# Patient Record
Sex: Male | Born: 1985 | Hispanic: No | Marital: Single | Smoking: Never smoker
Health system: Southern US, Community
[De-identification: ages and names within clinical notes are randomized; demographics above are authoritative.]

## PROBLEM LIST (undated history)

## (undated) HISTORY — PX: SPINE SURGERY: SHX786

---
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
## 2008-01-13 ENCOUNTER — Emergency Department: Payer: Self-pay | Admitting: Emergency Medicine

## 2009-01-17 ENCOUNTER — Ambulatory Visit: Payer: Self-pay | Admitting: Podiatry

## 2010-07-08 ENCOUNTER — Emergency Department: Payer: Self-pay | Admitting: Emergency Medicine

## 2010-12-16 ENCOUNTER — Emergency Department: Payer: Self-pay | Admitting: Unknown Physician Specialty

## 2011-01-11 ENCOUNTER — Emergency Department: Payer: Self-pay | Admitting: *Deleted

## 2011-08-18 DIAGNOSIS — M4120 Other idiopathic scoliosis, site unspecified: Secondary | ICD-10-CM | POA: Insufficient documentation

## 2011-08-18 DIAGNOSIS — M549 Dorsalgia, unspecified: Secondary | ICD-10-CM | POA: Insufficient documentation

## 2013-05-03 ENCOUNTER — Ambulatory Visit: Payer: Self-pay | Admitting: Podiatry

## 2013-06-19 ENCOUNTER — Encounter: Payer: Self-pay | Admitting: Podiatry

## 2013-06-19 ENCOUNTER — Ambulatory Visit: Payer: Self-pay | Admitting: Podiatry

## 2013-06-19 ENCOUNTER — Ambulatory Visit (INDEPENDENT_AMBULATORY_CARE_PROVIDER_SITE_OTHER): Payer: Medicare Other | Admitting: Podiatry

## 2013-06-19 ENCOUNTER — Ambulatory Visit (INDEPENDENT_AMBULATORY_CARE_PROVIDER_SITE_OTHER): Payer: Medicare Other

## 2013-06-19 VITALS — BP 77/50 | HR 110 | Resp 16 | Ht 70.0 in | Wt 220.0 lb

## 2013-06-19 DIAGNOSIS — M79609 Pain in unspecified limb: Secondary | ICD-10-CM

## 2013-06-19 DIAGNOSIS — B351 Tinea unguium: Secondary | ICD-10-CM

## 2013-06-19 DIAGNOSIS — S92919A Unspecified fracture of unspecified toe(s), initial encounter for closed fracture: Secondary | ICD-10-CM

## 2013-06-19 DIAGNOSIS — S92401A Displaced unspecified fracture of right great toe, initial encounter for closed fracture: Secondary | ICD-10-CM

## 2013-06-19 DIAGNOSIS — M79671 Pain in right foot: Secondary | ICD-10-CM

## 2013-06-19 NOTE — Progress Notes (Signed)
He presents today with a chief complaint of painful elongated toenails and painful calluses to the bilateral foot. He states that recently he has fallen by slipping on wet substances grandmothers house and hurting his right foot. He states that most of the pain is right here as he points to the base of the first metatarsal phalangeal joint..  Objective evaluation reveals vital signs are stable he is alert and oriented x3. Nails are thick yellow dystrophic clinically mycotic. He also has reactive hyperkeratosis to the medial aspect of the IP joints of the hallux bilateral his edema to the right foot overlying the metatarsophalangeal joint with pain on direct palpation of the proximal phalanx lateral aspect. Radiographic evaluation on 1 view only does demonstrate a very small fracture of the proximal phalanx nondisplaced non-comminuted.  Assessment: Pain in limb secondary to onychomycosis bilateral. Thick calluses IP joint hallux right. Fracture proximal phalanx hallux right.  Plan: Debridement of all reactive hyperkeratosis and nails 1 through 5 bilateral covered service dispensed a Darco shoe today we'll followup with him in 2 months for set of x-rays

## 2013-07-26 ENCOUNTER — Telehealth: Payer: Self-pay | Admitting: *Deleted

## 2013-07-26 NOTE — Telephone Encounter (Signed)
Pt called and left message stating: i almost fell when i was out in public, my foot does not want to listen and i can not stand on it. i spoke with dr Milinda Pointer and per dr Milinda Pointer pt is to go to emergency room for his foot. i called pt back and received voicemail. i left message stating per dr Milinda Pointer go to emergency room and if you have any questions or concerns to call me back.

## 2013-08-23 ENCOUNTER — Ambulatory Visit (INDEPENDENT_AMBULATORY_CARE_PROVIDER_SITE_OTHER): Payer: Medicare Other

## 2013-08-23 ENCOUNTER — Ambulatory Visit (INDEPENDENT_AMBULATORY_CARE_PROVIDER_SITE_OTHER): Payer: Medicare Other | Admitting: Podiatry

## 2013-08-23 VITALS — Resp 16 | Ht 70.0 in | Wt 220.0 lb

## 2013-08-23 DIAGNOSIS — S92919A Unspecified fracture of unspecified toe(s), initial encounter for closed fracture: Secondary | ICD-10-CM

## 2013-08-23 DIAGNOSIS — M79609 Pain in unspecified limb: Secondary | ICD-10-CM

## 2013-08-23 DIAGNOSIS — B351 Tinea unguium: Secondary | ICD-10-CM

## 2013-08-23 DIAGNOSIS — M79673 Pain in unspecified foot: Secondary | ICD-10-CM

## 2013-08-23 DIAGNOSIS — S92401A Displaced unspecified fracture of right great toe, initial encounter for closed fracture: Secondary | ICD-10-CM

## 2013-08-23 NOTE — Progress Notes (Signed)
He presents today chief complaint of painfully elongated toenails one through 5 bilateral. And swelling to the right hallux.  Objective: Pulses are palpable bilateral. Vital signs are stable he is alert oriented x3. He has mild tenderness on palpation of the proximal phalanx of the hallux. Radiographically this appears to be healing quite nicely. His nails are thick yellow dystrophic onychomycotic with reactive hyperkeratosis plantar medial aspect of the first metatarsophalangeal joint and the hallux bilateral.  Assessment: Pain in limb secondary to onychomycosis bilateral. Well healing hallux fracture right.  Plan: Debridement of nails 1 through 5 bilateral is cover service secondary to pain and debridement all reactive hyperkeratosis.

## 2013-11-29 ENCOUNTER — Ambulatory Visit: Payer: Medicare Other | Admitting: Podiatry

## 2013-11-29 ENCOUNTER — Ambulatory Visit (INDEPENDENT_AMBULATORY_CARE_PROVIDER_SITE_OTHER): Payer: Medicare Other | Admitting: Podiatry

## 2013-11-29 VITALS — BP 103/51 | HR 94 | Resp 16

## 2013-11-29 DIAGNOSIS — M79676 Pain in unspecified toe(s): Secondary | ICD-10-CM

## 2013-11-29 DIAGNOSIS — M79609 Pain in unspecified limb: Secondary | ICD-10-CM

## 2013-11-29 DIAGNOSIS — B351 Tinea unguium: Secondary | ICD-10-CM

## 2013-11-29 NOTE — Progress Notes (Signed)
He presents today complaining of painful corns and calluses as well as painful elongated toenails.  Objective: Vital signs are stable he is alert and oriented x3. Pulses are palpable bilateral. Nails are thick yellow dystrophic with mycotic multiple calluses plantar medial aspect of the first metatarsophalangeal joint of the right foot and IP joint of the hallux bilaterally.  Assessment: Pain in limb secondary to onychomycosis and

## 2014-03-12 ENCOUNTER — Ambulatory Visit: Payer: Medicare Other | Admitting: Podiatry

## 2014-04-16 ENCOUNTER — Ambulatory Visit (INDEPENDENT_AMBULATORY_CARE_PROVIDER_SITE_OTHER): Payer: Medicare Other | Admitting: Podiatry

## 2014-04-16 DIAGNOSIS — B351 Tinea unguium: Secondary | ICD-10-CM

## 2014-04-16 DIAGNOSIS — Q828 Other specified congenital malformations of skin: Secondary | ICD-10-CM

## 2014-04-16 DIAGNOSIS — E119 Type 2 diabetes mellitus without complications: Secondary | ICD-10-CM

## 2014-04-16 DIAGNOSIS — M79676 Pain in unspecified toe(s): Secondary | ICD-10-CM

## 2014-04-16 NOTE — Progress Notes (Signed)
He presents today complaining of painful corns and calluses as well as painful elongated toenails.  Objective: Vital signs are stable he is alert and oriented x3. Pulses are palpable bilateral. Nails are thick yellow dystrophic with mycotic multiple calluses plantar medial aspect of the first metatarsophalangeal joint of the right foot and IP joint of the hallux bilaterally. Painful porokeratotic lesion fifth toe left foot.  Assessment: Pain in limb secondary to onychomycosis and porokeratosis. Diabetes.  Plan: Debrided nails 1 through 5 bilateral. Debrided or keratotic lesion plantar aspect fifth toe left foot. 

## 2014-04-23 ENCOUNTER — Ambulatory Visit: Payer: Medicare Other | Admitting: Podiatry

## 2014-07-16 ENCOUNTER — Ambulatory Visit (INDEPENDENT_AMBULATORY_CARE_PROVIDER_SITE_OTHER): Payer: Medicare Other | Admitting: Podiatry

## 2014-07-16 DIAGNOSIS — Q828 Other specified congenital malformations of skin: Secondary | ICD-10-CM

## 2014-07-16 DIAGNOSIS — M79676 Pain in unspecified toe(s): Secondary | ICD-10-CM | POA: Diagnosis not present

## 2014-07-16 DIAGNOSIS — B351 Tinea unguium: Secondary | ICD-10-CM

## 2014-07-16 NOTE — Progress Notes (Signed)
He presents today complaining of painful corns and calluses as well as painful elongated toenails.  Objective: Vital signs are stable he is alert and oriented x3. Pulses are palpable bilateral. Nails are thick yellow dystrophic with mycotic multiple calluses plantar medial aspect of the first metatarsophalangeal joint of the right foot and IP joint of the hallux bilaterally. Painful porokeratotic lesion fifth toe left foot.  Assessment: Pain in limb secondary to onychomycosis and porokeratosis. Diabetes.  Plan: Debrided nails 1 through 5 bilateral. Debrided or keratotic lesion plantar aspect fifth toe left foot.

## 2014-08-15 DIAGNOSIS — F32A Depression, unspecified: Secondary | ICD-10-CM | POA: Insufficient documentation

## 2014-08-29 DIAGNOSIS — J45909 Unspecified asthma, uncomplicated: Secondary | ICD-10-CM | POA: Insufficient documentation

## 2014-09-07 DIAGNOSIS — E785 Hyperlipidemia, unspecified: Secondary | ICD-10-CM | POA: Insufficient documentation

## 2014-10-08 ENCOUNTER — Ambulatory Visit (INDEPENDENT_AMBULATORY_CARE_PROVIDER_SITE_OTHER): Payer: Medicare Other | Admitting: Podiatry

## 2014-10-08 DIAGNOSIS — E119 Type 2 diabetes mellitus without complications: Secondary | ICD-10-CM | POA: Diagnosis not present

## 2014-10-08 DIAGNOSIS — M79676 Pain in unspecified toe(s): Secondary | ICD-10-CM | POA: Diagnosis not present

## 2014-10-08 DIAGNOSIS — Q828 Other specified congenital malformations of skin: Secondary | ICD-10-CM

## 2014-10-08 DIAGNOSIS — B351 Tinea unguium: Secondary | ICD-10-CM | POA: Diagnosis not present

## 2014-10-08 NOTE — Progress Notes (Signed)
He presents today complaining of painful corns and calluses as well as painful elongated toenails.  Objective: Vital signs are stable he is alert and oriented x3. Pulses are palpable bilateral. Nails are thick yellow dystrophic with mycotic multiple calluses plantar medial aspect of the first metatarsophalangeal joint of the right foot and IP joint of the hallux bilaterally. Painful porokeratotic R hallux lesion noted  Assessment: Pain in limb secondary to onychomycosis and porokeratosis. Diabetes.  Plan: Debrided nails 1 through 5 bilateral. Debrided all lesions, especially R hallux.           Heels also reduced

## 2015-01-09 ENCOUNTER — Ambulatory Visit: Payer: Medicare Other | Admitting: Podiatry

## 2015-01-14 ENCOUNTER — Ambulatory Visit (INDEPENDENT_AMBULATORY_CARE_PROVIDER_SITE_OTHER): Payer: Medicare Other | Admitting: Podiatry

## 2015-01-14 DIAGNOSIS — M79676 Pain in unspecified toe(s): Secondary | ICD-10-CM | POA: Diagnosis not present

## 2015-01-14 DIAGNOSIS — B351 Tinea unguium: Secondary | ICD-10-CM

## 2015-01-14 DIAGNOSIS — Q828 Other specified congenital malformations of skin: Secondary | ICD-10-CM

## 2015-01-14 DIAGNOSIS — E119 Type 2 diabetes mellitus without complications: Secondary | ICD-10-CM

## 2015-01-14 NOTE — Progress Notes (Signed)
He presents today complaining of painful corns and calluses as well as painful elongated toenails.  Objective: Vital signs are stable he is alert and oriented x3. Pulses are palpable bilateral. Nails are thick yellow dystrophic with mycotic multiple calluses plantar medial aspect of the first metatarsophalangeal joint of the right foot and IP joint of the hallux bilaterally. Painful porokeratotic R hallux lesion noted  Assessment: Pain in limb secondary to onychomycosis and porokeratosis. Diabetes.  Plan: Debrided nails 1 through 5 bilateral. Debrided all lesions, especially R hallux.           Heels also reduced and all hyperkeratotic tissue was sharply resected. I encouraged him to soak his feet before coming in for easier debridement.

## 2015-03-07 DIAGNOSIS — M24551 Contracture, right hip: Secondary | ICD-10-CM | POA: Insufficient documentation

## 2015-04-04 ENCOUNTER — Telehealth: Payer: Self-pay | Admitting: *Deleted

## 2015-04-04 NOTE — Telephone Encounter (Signed)
Pt states he is returning a call from a nurse called Gareth Eagle.  I spoke with pt and told him we did not have a Gareth Eagle working here, but I would see if possibly a scheduler called.  I transferred to schedulers and was told they had needed to reschedule pt's appt.

## 2015-04-15 ENCOUNTER — Ambulatory Visit: Payer: Medicare Other

## 2015-04-29 ENCOUNTER — Encounter: Payer: Self-pay | Admitting: Podiatry

## 2015-04-29 ENCOUNTER — Ambulatory Visit (INDEPENDENT_AMBULATORY_CARE_PROVIDER_SITE_OTHER): Payer: Medicare Other | Admitting: Podiatry

## 2015-04-29 DIAGNOSIS — M79676 Pain in unspecified toe(s): Secondary | ICD-10-CM

## 2015-04-29 DIAGNOSIS — B351 Tinea unguium: Secondary | ICD-10-CM

## 2015-04-29 DIAGNOSIS — Q828 Other specified congenital malformations of skin: Secondary | ICD-10-CM

## 2015-04-29 NOTE — Progress Notes (Signed)
He presents today with chief complaint of painful elongated toenails with corns and calluses bilaterally.  Objective: Vital signs are stable he is alert and oriented 3. Pulses are palpable neurologic sensorium is diminished her Semmes-Weinstein monofilament. Reactive hyperkeratosis plantar aspect of bilateral foot with thick yellow dystrophic onychomycotic painful nails.  Assessment: Pain in limb segment onychomycosis. Pain in limb secondary to porokeratosis bilateral.  Plan: Debridement of all reactive hyperkeratosis in toenails bilaterally. Follow up with him in 3 months.

## 2015-07-29 ENCOUNTER — Encounter: Payer: Self-pay | Admitting: Podiatry

## 2015-07-29 ENCOUNTER — Ambulatory Visit (INDEPENDENT_AMBULATORY_CARE_PROVIDER_SITE_OTHER): Payer: Medicare Other | Admitting: Podiatry

## 2015-07-29 DIAGNOSIS — B351 Tinea unguium: Secondary | ICD-10-CM | POA: Diagnosis not present

## 2015-07-29 DIAGNOSIS — Q828 Other specified congenital malformations of skin: Secondary | ICD-10-CM | POA: Diagnosis not present

## 2015-07-29 DIAGNOSIS — M79676 Pain in unspecified toe(s): Secondary | ICD-10-CM

## 2015-07-29 NOTE — Progress Notes (Signed)
He presents today with a chief complaint of painful elongated toenails with calluses to the medial aspect of the first metatarsophalangeal joints bilaterally.  Objective: Vital signs are stable alert and oriented 3. Pulses are palpable. He has severe overgrowth of skin along the medial aspect of the first metatarsophalangeal joint extending out onto the hallux and level of the IP joint. His toenails are thick yellow dystrophic with mycotic and painful palpation.  Assessment: Pain in limb secondary to onychomycosis and porokeratosis.  Plan: Debridement of toenails and callus material bilateral foot. Follow up with [place] in 3 months.

## 2015-10-30 ENCOUNTER — Ambulatory Visit (INDEPENDENT_AMBULATORY_CARE_PROVIDER_SITE_OTHER): Payer: Medicare Other | Admitting: Podiatry

## 2015-10-30 DIAGNOSIS — E119 Type 2 diabetes mellitus without complications: Secondary | ICD-10-CM | POA: Diagnosis not present

## 2015-10-30 DIAGNOSIS — Q828 Other specified congenital malformations of skin: Secondary | ICD-10-CM | POA: Diagnosis not present

## 2015-10-30 DIAGNOSIS — M79676 Pain in unspecified toe(s): Secondary | ICD-10-CM | POA: Diagnosis not present

## 2015-10-30 DIAGNOSIS — B351 Tinea unguium: Secondary | ICD-10-CM

## 2015-10-30 NOTE — Progress Notes (Signed)
He presents today with chief complaint of painful elongated toenails and calluses bilateral.  Objective: Vital signs are stable alert and oriented 3. Pulses are palpable. His toenails are thick yellow dystrophic onychomycotic painful palpation. This thick yellow dystrophic clinic with mycotic nails with multiple reactive hyperkeratosis medial aspect of the foot and the heels bilaterally.  Assessment: Severe xerosis with reactive hyperkeratosis pain in limb secondary to onychomycosis.  Plan: Debridement of toenails in all reactive hyperkeratosis follow up with me as needed.

## 2016-02-05 ENCOUNTER — Encounter: Payer: Self-pay | Admitting: Podiatry

## 2016-02-05 ENCOUNTER — Ambulatory Visit (INDEPENDENT_AMBULATORY_CARE_PROVIDER_SITE_OTHER): Payer: Medicare Other | Admitting: Podiatry

## 2016-02-05 DIAGNOSIS — B351 Tinea unguium: Secondary | ICD-10-CM

## 2016-02-05 DIAGNOSIS — Q828 Other specified congenital malformations of skin: Secondary | ICD-10-CM | POA: Diagnosis not present

## 2016-02-05 DIAGNOSIS — M79676 Pain in unspecified toe(s): Secondary | ICD-10-CM | POA: Diagnosis not present

## 2016-02-05 NOTE — Progress Notes (Signed)
He presents today with chief complaint of painful elongated toenails and thick corns and calluses bilateral foot.  Objective: Pulses are strongly palpable. Toenails are thick yellow dystrophic onychomycotic porokeratotic lesions and calluses plantar aspect of bilateral foot.  Assessment: Pain and limited secondary to onychomycosis keratosis.  Plan: Debridement of toenails 1 through 5 bilateral and debridement of all reactive hyperkeratoses.

## 2016-04-01 ENCOUNTER — Encounter: Payer: Self-pay | Admitting: Podiatry

## 2016-04-01 ENCOUNTER — Ambulatory Visit (INDEPENDENT_AMBULATORY_CARE_PROVIDER_SITE_OTHER): Payer: Medicare Other | Admitting: Podiatry

## 2016-04-01 DIAGNOSIS — B351 Tinea unguium: Secondary | ICD-10-CM

## 2016-04-01 DIAGNOSIS — M79676 Pain in unspecified toe(s): Secondary | ICD-10-CM

## 2016-04-01 DIAGNOSIS — Q828 Other specified congenital malformations of skin: Secondary | ICD-10-CM

## 2016-04-01 NOTE — Progress Notes (Signed)
He presents today chief complaint of painful elongated toenails and calluses of porokeratosis to the plantar aspect of the foot and the fifth toe left.  Objective: Vital signs are stable he is alert and oriented 3 no open lesions or wounds are noted. Pulses are palpable. He has multiple distal clavi porokeratotic lesions plantar aspect of the bilateral foot his toenails are thick yellow dystrophic mycotic. Severe reactive hyperkeratosis to the heels.  Assessment: Pain in limb secondary to onychomycosis and porokeratosis with reactive hyperkeratotic tissue.  Plan: Debridement of all reactive hyperkeratotic tissue and debridement of toenails 1 through 5 bilateral.

## 2016-05-06 ENCOUNTER — Ambulatory Visit: Payer: Medicare Other | Admitting: Podiatry

## 2016-06-29 ENCOUNTER — Ambulatory Visit (INDEPENDENT_AMBULATORY_CARE_PROVIDER_SITE_OTHER): Payer: Medicare Other | Admitting: Podiatry

## 2016-06-29 ENCOUNTER — Encounter: Payer: Self-pay | Admitting: Podiatry

## 2016-06-29 DIAGNOSIS — B351 Tinea unguium: Secondary | ICD-10-CM

## 2016-06-29 DIAGNOSIS — E119 Type 2 diabetes mellitus without complications: Secondary | ICD-10-CM | POA: Diagnosis not present

## 2016-06-29 DIAGNOSIS — M79676 Pain in unspecified toe(s): Secondary | ICD-10-CM

## 2016-06-29 DIAGNOSIS — Q828 Other specified congenital malformations of skin: Secondary | ICD-10-CM | POA: Diagnosis not present

## 2016-06-29 NOTE — Progress Notes (Signed)
He presents today chief complaint of painful elongated toenails with calluses.  Objective: Toenails are long thick yellow dystrophic onychomycotic and porokeratotic lesions with thick tyloma's plantarly. No open lesions or wounds are noticed today.  Assessment: Pain in limb secondary to onychomycosis porokeratosis and tyloma's.  Plan: Debrided all reactive hyperkeratoses. Debrided toenails 1 through 5 bilateral.

## 2016-09-07 ENCOUNTER — Ambulatory Visit (INDEPENDENT_AMBULATORY_CARE_PROVIDER_SITE_OTHER): Payer: Medicare Other | Admitting: Podiatry

## 2016-09-07 ENCOUNTER — Encounter: Payer: Self-pay | Admitting: Podiatry

## 2016-09-07 ENCOUNTER — Telehealth: Payer: Self-pay | Admitting: *Deleted

## 2016-09-07 DIAGNOSIS — Q828 Other specified congenital malformations of skin: Secondary | ICD-10-CM

## 2016-09-07 DIAGNOSIS — M79676 Pain in unspecified toe(s): Secondary | ICD-10-CM

## 2016-09-07 DIAGNOSIS — B351 Tinea unguium: Secondary | ICD-10-CM | POA: Diagnosis not present

## 2016-09-07 DIAGNOSIS — E119 Type 2 diabetes mellitus without complications: Secondary | ICD-10-CM

## 2016-09-07 NOTE — Telephone Encounter (Signed)
Pt states he saw his doctor for his knees and they directed him to Dr. Milinda Pointer. I told pt that in some cases if feet did not have good support it would bother the knees and transferred to schedulers.

## 2016-09-07 NOTE — Progress Notes (Signed)
He presents today to complaint of painful elongated toenails and calluses bilateral. He has a history of neuropathy associated with scoliosis.  Objective: Vital signs are stable he is alert and oriented 3. Pulses are palpable. No erythema edema cellulitis drainage or odor. Calluses are thick yellow dystrophic onychomycotic as are his calluses. No open lesions or wounds are noted.  Assessment: Pain limb cemented onychomycosis and calluses.  Plan: Debrided all reactive hyperkeratoses and toenails.

## 2016-09-28 ENCOUNTER — Ambulatory Visit: Payer: Medicare Other | Admitting: Podiatry

## 2016-10-26 DIAGNOSIS — M1990 Unspecified osteoarthritis, unspecified site: Secondary | ICD-10-CM | POA: Insufficient documentation

## 2016-12-07 ENCOUNTER — Encounter: Payer: Self-pay | Admitting: Podiatry

## 2016-12-07 ENCOUNTER — Ambulatory Visit (INDEPENDENT_AMBULATORY_CARE_PROVIDER_SITE_OTHER): Payer: Medicare Other | Admitting: Podiatry

## 2016-12-07 DIAGNOSIS — B351 Tinea unguium: Secondary | ICD-10-CM

## 2016-12-07 DIAGNOSIS — E119 Type 2 diabetes mellitus without complications: Secondary | ICD-10-CM

## 2016-12-07 DIAGNOSIS — Q828 Other specified congenital malformations of skin: Secondary | ICD-10-CM | POA: Diagnosis not present

## 2016-12-07 DIAGNOSIS — M79676 Pain in unspecified toe(s): Secondary | ICD-10-CM | POA: Diagnosis not present

## 2016-12-07 NOTE — Progress Notes (Signed)
He presents today to complaint of painful elongated toenails and calluses.  Objective: Pulses remain palpable no open lesions or wounds multiple or keratome was long thick yellow dystrophic lytic mycotic nails subungual hematoma fourth nail right. No open lesions.  Assessment: Pain in limb segment onychomycosis porokeratosis diabetes.  Plan: Debridement toenails and reactive hyperkeratotic tissue follow-up with him in 3 months.

## 2017-01-29 DIAGNOSIS — R7302 Impaired glucose tolerance (oral): Secondary | ICD-10-CM | POA: Insufficient documentation

## 2017-03-15 ENCOUNTER — Encounter: Payer: Self-pay | Admitting: Podiatry

## 2017-03-15 ENCOUNTER — Ambulatory Visit (INDEPENDENT_AMBULATORY_CARE_PROVIDER_SITE_OTHER): Payer: Medicare Other | Admitting: Podiatry

## 2017-03-15 DIAGNOSIS — Q828 Other specified congenital malformations of skin: Secondary | ICD-10-CM

## 2017-03-15 DIAGNOSIS — M79676 Pain in unspecified toe(s): Secondary | ICD-10-CM

## 2017-03-15 DIAGNOSIS — B351 Tinea unguium: Secondary | ICD-10-CM | POA: Diagnosis not present

## 2017-03-15 DIAGNOSIS — E119 Type 2 diabetes mellitus without complications: Secondary | ICD-10-CM | POA: Diagnosis not present

## 2017-03-16 NOTE — Progress Notes (Signed)
He presents today with a chief complaint of painful toenails and calluses.  Objective:No lesions or wounds. Toenare thick yellow dystrophic onychomycotic and painful on palpation.  Assessment: Pain in limb secondary to onychomycosis.  Plan: Debrided toenails 1 through 5 bilateral.

## 2017-06-16 ENCOUNTER — Ambulatory Visit (INDEPENDENT_AMBULATORY_CARE_PROVIDER_SITE_OTHER): Payer: Medicare Other | Admitting: Podiatry

## 2017-06-16 ENCOUNTER — Encounter: Payer: Self-pay | Admitting: Podiatry

## 2017-06-16 DIAGNOSIS — E119 Type 2 diabetes mellitus without complications: Secondary | ICD-10-CM | POA: Diagnosis not present

## 2017-06-16 DIAGNOSIS — B351 Tinea unguium: Secondary | ICD-10-CM | POA: Diagnosis not present

## 2017-06-16 DIAGNOSIS — Q828 Other specified congenital malformations of skin: Secondary | ICD-10-CM | POA: Diagnosis not present

## 2017-06-16 DIAGNOSIS — M79676 Pain in unspecified toe(s): Secondary | ICD-10-CM

## 2017-06-16 NOTE — Progress Notes (Signed)
He presents today chief complaint of painful elongated toenails and calluses.  Objective: Vital signs are stable alert and oriented x3.  Pulses are palpable.  Neurologic sensorium is intact.  Deep tendon reflexes are intact.  Nails are thick yellow dystrophic-like mycotic.  Multiple calluses plantar aspect of bilateral foot.  Assessment: Pain in limb secondary onychomycosis.  Debridement of all reactive hyperkeratosis.  Debridement of toenails 1 through 5.

## 2017-09-22 ENCOUNTER — Ambulatory Visit (INDEPENDENT_AMBULATORY_CARE_PROVIDER_SITE_OTHER): Payer: Medicare Other | Admitting: Podiatry

## 2017-09-22 ENCOUNTER — Encounter: Payer: Self-pay | Admitting: Podiatry

## 2017-09-22 DIAGNOSIS — M79676 Pain in unspecified toe(s): Secondary | ICD-10-CM | POA: Diagnosis not present

## 2017-09-22 DIAGNOSIS — Q828 Other specified congenital malformations of skin: Secondary | ICD-10-CM

## 2017-09-22 DIAGNOSIS — B351 Tinea unguium: Secondary | ICD-10-CM | POA: Diagnosis not present

## 2017-09-23 NOTE — Progress Notes (Signed)
Subjective: 32 y.o. returns the office today for painful, elongated, thickened toenails which he cannot trim himself. Denies any redness or drainage around the nails.  He has had quite a bit of pain to the heels as the callus is very thick the left side worse than the right he also has calluses to both of his big toes.  Denies any acute changes since last appointment and no new complaints today. Denies any systemic complaints such as fevers, chills, nausea, vomiting.   PCP: Perrin Maltese, MD  He is borderline diabetic  Objective: AAO 3, NAD DP/PT pulses palpable, CRT less than 3 seconds Nails hypertrophic, dystrophic, elongated, brittle, discolored 10. There is tenderness overlying the nails 1-5 bilaterally. There is no surrounding erythema or drainage along the nail sites. Very thick hyperkeratotic lesions present to the plantar heels the left side worse than right little hyperkeratotic lesions bilateral medial hallux.  Upon debridement there is no underlying ulceration, drainage or any clinical signs of infection. No open lesions or pre-ulcerative lesions are identified. No other areas of tenderness bilateral lower extremities. No overlying edema, erythema, increased warmth. No pain with calf compression, swelling, warmth, erythema.  Assessment: Patient presents with symptomatic onychomycosis, reactive hyperkeratosis  Plan: -Treatment options including alternatives, risks, complications were discussed -Nails sharply debrided 10 without complication/bleeding. -Hyperkeratotic lesion sharply debrided x4 without any complications or bleeding -Discussed daily foot inspection. If there are any changes, to call the office immediately.  -Follow-up in 3 months or sooner if any problems are to arise. In the meantime, encouraged to call the office with any questions, concerns, changes symptoms.  Celesta Gentile, DPM

## 2017-10-20 ENCOUNTER — Ambulatory Visit: Payer: Medicare Other | Admitting: Podiatry

## 2017-12-27 ENCOUNTER — Encounter: Payer: Self-pay | Admitting: Podiatry

## 2017-12-27 ENCOUNTER — Ambulatory Visit (INDEPENDENT_AMBULATORY_CARE_PROVIDER_SITE_OTHER): Payer: Medicare Other | Admitting: Podiatry

## 2017-12-27 DIAGNOSIS — Q828 Other specified congenital malformations of skin: Secondary | ICD-10-CM

## 2017-12-27 DIAGNOSIS — B351 Tinea unguium: Secondary | ICD-10-CM | POA: Diagnosis not present

## 2017-12-27 DIAGNOSIS — M79676 Pain in unspecified toe(s): Secondary | ICD-10-CM | POA: Diagnosis not present

## 2017-12-27 NOTE — Progress Notes (Signed)
He presents today chief complaint of painful elongated toenails and calluses.  Objective: Vital signs are stable alert and oriented x3.  Pulses are palpable.  He still has a long thick yellow dystrophic-like mycotic severe thickening of the calluses bilaterally.  Assessment: Pain in limb secondary onychomycosis and callus development.  Plan: Debridement of all reactive hyperkeratotic tissue debridement of toenails bilaterally.

## 2018-03-04 DIAGNOSIS — K219 Gastro-esophageal reflux disease without esophagitis: Secondary | ICD-10-CM | POA: Insufficient documentation

## 2018-03-30 ENCOUNTER — Encounter: Payer: Self-pay | Admitting: Podiatry

## 2018-03-30 ENCOUNTER — Ambulatory Visit (INDEPENDENT_AMBULATORY_CARE_PROVIDER_SITE_OTHER): Payer: Medicare Other | Admitting: Podiatry

## 2018-03-30 DIAGNOSIS — E119 Type 2 diabetes mellitus without complications: Secondary | ICD-10-CM | POA: Diagnosis not present

## 2018-03-30 DIAGNOSIS — M79676 Pain in unspecified toe(s): Secondary | ICD-10-CM | POA: Diagnosis not present

## 2018-03-30 DIAGNOSIS — B351 Tinea unguium: Secondary | ICD-10-CM | POA: Diagnosis not present

## 2018-03-30 DIAGNOSIS — Q828 Other specified congenital malformations of skin: Secondary | ICD-10-CM

## 2018-03-30 NOTE — Progress Notes (Signed)
He presents today chief complaint of painful elongated toenails with severe corns and calluses.  Objective: Vital signs stable he is alert and oriented x3 toenails are long thick yellow dystrophic-like mycotic painful with severe tylomas plantar aspect of the bilateral foot with dry cracked fissures.  Assessment: Pain in limb secondary to onychomycosis and reactive hyperkeratosis.  Plan: Debridement of all reactive hyperkeratotic tissue debridement of toenails 1 through 5 bilateral.

## 2018-06-29 ENCOUNTER — Ambulatory Visit (INDEPENDENT_AMBULATORY_CARE_PROVIDER_SITE_OTHER): Payer: Medicare Other | Admitting: Podiatry

## 2018-06-29 ENCOUNTER — Encounter: Payer: Self-pay | Admitting: Podiatry

## 2018-06-29 DIAGNOSIS — E119 Type 2 diabetes mellitus without complications: Secondary | ICD-10-CM

## 2018-06-29 DIAGNOSIS — Q828 Other specified congenital malformations of skin: Secondary | ICD-10-CM | POA: Diagnosis not present

## 2018-06-29 DIAGNOSIS — B351 Tinea unguium: Secondary | ICD-10-CM | POA: Diagnosis not present

## 2018-06-29 DIAGNOSIS — M79676 Pain in unspecified toe(s): Secondary | ICD-10-CM | POA: Diagnosis not present

## 2018-06-29 NOTE — Progress Notes (Signed)
He presents today chief complaint painful elongated toenails and calluses.  Objective: Vital signs are stable alert and oriented x3.  Pulses are palpable.  Toenails are long thick yellow dystrophic onychomycotic severe reactive hyperkeratosis bilateral foot.  Assessment: Pain in limb secondary onychomycosis and reactive hyperkeratosis.  Plan: Debrided as much of the reactive hyperkeratotic tissue was I possibly could debrided toenails bilaterally.

## 2018-06-30 ENCOUNTER — Other Ambulatory Visit: Payer: Self-pay

## 2018-06-30 ENCOUNTER — Emergency Department
Admission: EM | Admit: 2018-06-30 | Discharge: 2018-06-30 | Disposition: A | Discharge status: Home / Self Care | Payer: Medicare Other | Attending: Emergency Medicine | Admitting: Emergency Medicine

## 2018-06-30 DIAGNOSIS — Z79899 Other long term (current) drug therapy: Secondary | ICD-10-CM | POA: Diagnosis not present

## 2018-06-30 DIAGNOSIS — A0811 Acute gastroenteropathy due to Norwalk agent: Secondary | ICD-10-CM | POA: Diagnosis not present

## 2018-06-30 DIAGNOSIS — Z91013 Allergy to seafood: Secondary | ICD-10-CM | POA: Insufficient documentation

## 2018-06-30 DIAGNOSIS — R112 Nausea with vomiting, unspecified: Secondary | ICD-10-CM | POA: Diagnosis present

## 2018-06-30 DIAGNOSIS — Z880 Allergy status to penicillin: Secondary | ICD-10-CM | POA: Diagnosis not present

## 2018-06-30 LAB — CBC WITH DIFFERENTIAL/PLATELET
Abs Immature Granulocytes: 0.09 10*3/uL — ABNORMAL HIGH (ref 0.00–0.07)
BASOS ABS: 0.1 10*3/uL (ref 0.0–0.1)
Basophils Relative: 0 %
EOS ABS: 0 10*3/uL (ref 0.0–0.5)
EOS PCT: 0 %
HEMATOCRIT: 46.9 % (ref 39.0–52.0)
HEMOGLOBIN: 14.4 g/dL (ref 13.0–17.0)
IMMATURE GRANULOCYTES: 1 %
LYMPHS ABS: 0.6 10*3/uL — AB (ref 0.7–4.0)
Lymphocytes Relative: 3 %
MCH: 24.7 pg — ABNORMAL LOW (ref 26.0–34.0)
MCHC: 30.7 g/dL (ref 30.0–36.0)
MCV: 80.6 fL (ref 80.0–100.0)
MONOS PCT: 6 %
Monocytes Absolute: 1.1 10*3/uL — ABNORMAL HIGH (ref 0.1–1.0)
NEUTROS PCT: 90 %
Neutro Abs: 17.4 10*3/uL — ABNORMAL HIGH (ref 1.7–7.7)
Platelets: 309 10*3/uL (ref 150–400)
RBC: 5.82 MIL/uL — ABNORMAL HIGH (ref 4.22–5.81)
RDW: 15.2 % (ref 11.5–15.5)
WBC: 19.3 10*3/uL — ABNORMAL HIGH (ref 4.0–10.5)
nRBC: 0 % (ref 0.0–0.2)

## 2018-06-30 LAB — COMPREHENSIVE METABOLIC PANEL
ALBUMIN: 4.4 g/dL (ref 3.5–5.0)
ALK PHOS: 98 U/L (ref 38–126)
ALT: 16 U/L (ref 0–44)
AST: 28 U/L (ref 15–41)
Anion gap: 11 (ref 5–15)
BILIRUBIN TOTAL: 0.9 mg/dL (ref 0.3–1.2)
BUN: 19 mg/dL (ref 6–20)
CO2: 25 mmol/L (ref 22–32)
Calcium: 8.7 mg/dL — ABNORMAL LOW (ref 8.9–10.3)
Chloride: 104 mmol/L (ref 98–111)
Creatinine, Ser: 0.99 mg/dL (ref 0.61–1.24)
GFR calc Af Amer: >60 mL/min (ref >60)
GLUCOSE: 186 mg/dL — AB (ref 70–99)
POTASSIUM: 3.7 mmol/L (ref 3.5–5.1)
Sodium: 140 mmol/L (ref 135–145)
TOTAL PROTEIN: 8.2 g/dL — AB (ref 6.5–8.1)

## 2018-06-30 LAB — GASTROINTESTINAL PANEL BY PCR, STOOL (REPLACES STOOL CULTURE)
ADENOVIRUS F40/41: NOT DETECTED
Astrovirus: NOT DETECTED
Campylobacter species: NOT DETECTED
Cryptosporidium: NOT DETECTED
Cyclospora cayetanensis: NOT DETECTED
ENTAMOEBA HISTOLYTICA: NOT DETECTED
ENTEROPATHOGENIC E COLI (EPEC): NOT DETECTED
ENTEROTOXIGENIC E COLI (ETEC): NOT DETECTED
Enteroaggregative E coli (EAEC): NOT DETECTED
GIARDIA LAMBLIA: NOT DETECTED
NOROVIRUS GI/GII: DETECTED — AB
Plesimonas shigelloides: NOT DETECTED
Rotavirus A: NOT DETECTED
SHIGELLA/ENTEROINVASIVE E COLI (EIEC): NOT DETECTED
Salmonella species: NOT DETECTED
Sapovirus (I, II, IV, and V): NOT DETECTED
Shiga like toxin producing E coli (STEC): NOT DETECTED
VIBRIO CHOLERAE: NOT DETECTED
VIBRIO SPECIES: NOT DETECTED
Yersinia enterocolitica: NOT DETECTED

## 2018-06-30 LAB — LIPASE, BLOOD: LIPASE: 23 U/L (ref 11–51)

## 2018-06-30 LAB — C DIFFICILE QUICK SCREEN W PCR REFLEX
C Diff antigen: NEGATIVE
C Diff interpretation: NOT DETECTED
C Diff toxin: NEGATIVE

## 2018-06-30 MED ORDER — SODIUM CHLORIDE 0.9 % IV BOLUS
1000.0000 mL | Freq: Once | INTRAVENOUS | Status: AC
  Administered 2018-06-30: 1000 mL via INTRAVENOUS

## 2018-06-30 MED ORDER — ONDANSETRON HCL 4 MG/2ML IJ SOLN
4.0000 mg | Freq: Once | INTRAMUSCULAR | Status: AC
  Administered 2018-06-30: 4 mg via INTRAVENOUS
  Filled 2018-06-30: qty 2

## 2018-06-30 MED ORDER — ONDANSETRON 4 MG PO TBDP: 4.0000 mg | ORAL_TABLET | Freq: Three times a day (TID) | ORAL | 0 refills | Status: DC | PRN

## 2018-06-30 MED ORDER — LOPERAMIDE HCL 2 MG PO CAPS
4.0000 mg | ORAL_CAPSULE | Freq: Once | ORAL | Status: AC
  Administered 2018-06-30: 4 mg via ORAL
  Filled 2018-06-30: qty 2

## 2018-06-30 MED ORDER — IBUPROFEN 600 MG PO TABS
600.0000 mg | ORAL_TABLET | Freq: Once | ORAL | Status: AC
  Administered 2018-06-30: 600 mg via ORAL
  Filled 2018-06-30: qty 1

## 2018-06-30 NOTE — ED Triage Notes (Signed)
Pt in with co n.v.d tonight, pt is co pain all over.

## 2018-06-30 NOTE — ED Provider Notes (Signed)
[facility] Emergency Department Provider Note _   First MD Initiated Contact with Patient 06/30/18 [id number]     (approximate)  I have reviewed the triage vital signs and the nursing notes.   HISTORY  Chief Complaint Emesis    HPI Christian Cherry is a 33 y.o. male presents to the emergency department acute onset of nausea vomiting and diarrhea that is nonbloody in nature.  Patient admits to abdominal cramping.  Patient states symptoms began after eating sushi last night.   No past medical history on file.  Patient Active Problem List   Diagnosis Date Noted  . 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 Back pain 08/18/2011  . Idiopathic scoliosis 08/18/2011      Prior to Admission medications   Medication Sig Start Date End Date Taking? Authorizing Provider  beclomethasone (QVAR REDIHALER) 80 MCG/ACT inhaler Qvar 80 mcg/actuation Metered Aerosol oral inhaler    [provider]  budesonide-formoterol (SYMBICORT) 80-4.5 MCG/ACT inhaler Symbicort 80 mcg-4.5 mcg/actuation HFA aerosol inhaler    [provider]  meloxicam (MOBIC) 15 MG tablet meloxicam 15 mg tablet  take 1 tablet by mouth once daily with food    [provider]  omeprazole (PRILOSEC) 40 MG capsule Take by mouth. 09/08/17   [provider]  ondansetron (ZOFRAN) 8 MG tablet ondansetron HCl 8 mg tablet    [provider]  PROAIR HFA 108 (90 BASE) MCG/ACT inhaler  02/25/15   [provider]  QVAR 80 MCG/ACT inhaler  02/19/15   [provider]  sertraline (ZOLOFT) 25 MG tablet Take by mouth.    [provider]  SUMAtriptan (IMITREX) 25 MG tablet Take by mouth.    [provider]  [facility] 80-4.5 MCG/ACT inhaler  02/25/15   [provider]  triamcinolone (KENALOG) 0.025 % cream apply thin layer to LEFT SIDE OF FACE twice a day for 7 to 10 days 01/07/17   [provider]  Vitamin D, Ergocalciferol,  (DRISDOL) 50000 units CAPS capsule Take 50,000 Units by mouth once a week. 01/07/17   [provider]    Allergies Eggs or egg-derived products; Gabapentin; Penicillins; Shellfish allergy; Shellfish-derived products; and Sulfa antibiotics  No family history on file.  Social History Social History   Tobacco Use  . Smoking status: Never Smoker  . Smokeless tobacco: Never Used  Substance Use Topics  . Alcohol use: Yes    Comment: rarely  . Drug use: Not on file    Review of Systems Constitutional: No fever/chills Eyes: No visual changes. ENT: No sore throat. Cardiovascular: Denies chest pain. Respiratory: Denies shortness of breath. Gastrointestinal: Positive for abdominal cramping nausea and vomiting.  Positive diarrhea Genitourinary: Negative for dysuria. Musculoskeletal: Negative for neck pain.  Negative for back pain. Integumentary: Negative for rash. Neurological: Negative for headaches, focal weakness or numbness.   ____________________________________________   PHYSICAL EXAM:  VITAL SIGNS: ED Triage Vitals  Enc Vitals Group     BP 06/30/18 0330 115/74     Pulse Rate 06/30/18 0330 (!) 126     Resp 06/30/18 0330 20     Temp 06/30/18 0330 99.9 F (37.7 C)     Temp Source 06/30/18 0330 Oral     SpO2 06/30/18 0330 100 %     Weight 06/30/18 0331 99.8 kg (220 lb)     Height 06/30/18 0331 1.753 m (5\' 9" )     Head Circumference --      Peak Flow --  Pain Score 06/30/18 0331 10     Pain Loc --      Pain Edu? --      Excl. in [place]? --     Constitutional: Alert and oriented. Well appearing and in no acute distress. Eyes: Conjunctivae are normal.  Mouth/Throat: Mucous membranes are moist.  Oropharynx non-erythematous. Neck: No stridor.  Cardiovascular: Normal rate, regular rhythm. Good peripheral circulation. Grossly normal heart sounds. Respiratory: Normal respiratory effort.  No retractions. Lungs CTAB. Gastrointestinal: Soft and nontender. No  distention.  Musculoskeletal: No lower extremity tenderness nor edema. No gross deformities of extremities. Neurologic:  Normal speech and language. No gross focal neurologic deficits are appreciated.  Skin:  Skin is warm, dry and intact. No rash noted. Psychiatric: Mood and affect are normal. Speech and behavior are normal.  ____________________________________________   LABS (all labs ordered are listed, but only abnormal results are displayed)  Labs Reviewed  GASTROINTESTINAL PANEL BY PCR, STOOL (REPLACES STOOL CULTURE) - Abnormal; Notable for the following components:      Result Value   Norovirus GI/GII DETECTED (*)    All other components within normal limits  CBC WITH DIFFERENTIAL/PLATELET - Abnormal; Notable for the following components:   WBC 19.3 (*)    RBC 5.82 (*)    MCH 24.7 (*)    Neutro Abs 17.4 (*)    Lymphs Abs 0.6 (*)    Monocytes Absolute 1.1 (*)    Abs Immature Granulocytes 0.09 (*)    All other components within normal limits  COMPREHENSIVE METABOLIC PANEL - Abnormal; Notable for the following components:   Glucose, Bld 186 (*)    Calcium 8.7 (*)    Total Protein 8.2 (*)    All other components within normal limits  C DIFFICILE QUICK SCREEN W PCR REFLEX  LIPASE, BLOOD  URINALYSIS, COMPLETE (UACMP) WITH MICROSCOPIC     Procedures   ____________________________________________   INITIAL IMPRESSION / ASSESSMENT AND PLAN / ED COURSE  As part of my medical decision making, I reviewed the following data within the electronic MEDICAL RECORD NUMBER   33 year old male presenting with above-stated history and physical exam secondary to nausea vomiting and diarrhea.  Concern for possible infectious diarrhea and as such stool sample obtained which was positive for norovirus.  Patient given 2 L IV normal saline Zofran 4 mg, Imodium 4 mg p.o. ____________________________________________  FINAL CLINICAL IMPRESSION(S) / ED DIAGNOSES  Final diagnoses:  Enteritis  due to Norovirus     MEDICATIONS GIVEN DURING THIS VISIT:  Medications  ibuprofen (ADVIL,MOTRIN) tablet 600 mg (has no administration in time range)  sodium chloride 0.9 % bolus 1,000 mL (1,000 mLs Intravenous New Bag/Given 06/30/18 0425)  sodium chloride 0.9 % bolus 1,000 mL (0 mLs Intravenous Stopped 06/30/18 0546)  ondansetron (ZOFRAN) injection 4 mg (4 mg Intravenous Given 06/30/18 0426)     ED Discharge Orders    None       Note:  This document was prepared using Dragon voice recognition software and may include unintentional dictation errors.   Gregor Hams, MD 06/30/18 [phone]

## 2018-09-28 ENCOUNTER — Ambulatory Visit: Payer: Medicare Other | Admitting: Podiatry

## 2018-09-28 ENCOUNTER — Encounter: Payer: Self-pay | Admitting: Podiatry

## 2018-09-28 ENCOUNTER — Other Ambulatory Visit: Payer: Self-pay

## 2018-09-28 ENCOUNTER — Ambulatory Visit (INDEPENDENT_AMBULATORY_CARE_PROVIDER_SITE_OTHER): Payer: Medicare Other | Admitting: Podiatry

## 2018-09-28 ENCOUNTER — Ambulatory Visit (INDEPENDENT_AMBULATORY_CARE_PROVIDER_SITE_OTHER): Payer: Medicare Other

## 2018-09-28 VITALS — Temp 98.1°F

## 2018-09-28 DIAGNOSIS — S99922A Unspecified injury of left foot, initial encounter: Secondary | ICD-10-CM | POA: Diagnosis not present

## 2018-09-28 DIAGNOSIS — B351 Tinea unguium: Secondary | ICD-10-CM | POA: Diagnosis not present

## 2018-09-28 DIAGNOSIS — Q828 Other specified congenital malformations of skin: Secondary | ICD-10-CM | POA: Diagnosis not present

## 2018-09-28 DIAGNOSIS — M79676 Pain in unspecified toe(s): Secondary | ICD-10-CM

## 2018-09-28 DIAGNOSIS — E119 Type 2 diabetes mellitus without complications: Secondary | ICD-10-CM | POA: Diagnosis not present

## 2018-09-28 NOTE — Progress Notes (Signed)
He presents today chief complaint of pain to the bilateral forefoot and calluses bilaterally.  Toenails are long and painful.  He is also complaining of injury to the dorsal aspect of the left foot.  Objective: Pulses are palpable.  Swollen left foot with ecchymosis to the toes.  Tender on palpation.  Laceration which appears to be healing very nicely no signs of infection.  Some mild erythema surrounding the border of the lacerated area with scab present.  Toenails are long thick yellow dystrophic-like mycotic very thick callused heels and forefoot bilateral.  Assessment: Pain limb secondary to onychomycosis.  Pain limb secondary to reactive hyper keratomas bilateral.  Radiographs taken today demonstrate fractures of the base of the fourth proximal phalanx left foot.  Plan: Debrided all reactive hyperkeratotic tissue and debrided toenails 1 through 5 bilateral also placed him in a Ace bandage to left foot.

## 2018-12-28 ENCOUNTER — Other Ambulatory Visit: Payer: Self-pay

## 2018-12-28 ENCOUNTER — Ambulatory Visit (INDEPENDENT_AMBULATORY_CARE_PROVIDER_SITE_OTHER): Payer: Medicare Other

## 2018-12-28 ENCOUNTER — Ambulatory Visit (INDEPENDENT_AMBULATORY_CARE_PROVIDER_SITE_OTHER): Payer: Medicare Other | Admitting: Podiatry

## 2018-12-28 ENCOUNTER — Encounter: Payer: Self-pay | Admitting: Podiatry

## 2018-12-28 ENCOUNTER — Ambulatory Visit: Payer: Medicare Other | Admitting: Podiatry

## 2018-12-28 VITALS — Temp 98.5°F

## 2018-12-28 DIAGNOSIS — B351 Tinea unguium: Secondary | ICD-10-CM | POA: Diagnosis not present

## 2018-12-28 DIAGNOSIS — Q828 Other specified congenital malformations of skin: Secondary | ICD-10-CM

## 2018-12-28 DIAGNOSIS — S92502D Displaced unspecified fracture of left lesser toe(s), subsequent encounter for fracture with routine healing: Secondary | ICD-10-CM

## 2018-12-28 DIAGNOSIS — M79676 Pain in unspecified toe(s): Secondary | ICD-10-CM | POA: Diagnosis not present

## 2018-12-28 DIAGNOSIS — E119 Type 2 diabetes mellitus without complications: Secondary | ICD-10-CM | POA: Diagnosis not present

## 2018-12-28 NOTE — Progress Notes (Signed)
He presents today chief complaint of painful elongated toenails and corns and calluses.  Objective: Toenails are thick yellow dystrophic 20 mycotic painful palpation as well as debridement.  Calluses are thick yellow dystrophic.  Assessment: Pain limb secondary onychomycosis porokeratosis.  Plan: Debridement of all reactive hyperkeratotic tissue debridement of toenails 1 through 5 bilateral.

## 2019-03-29 ENCOUNTER — Ambulatory Visit: Payer: Medicare Other | Admitting: Podiatry

## 2019-04-12 ENCOUNTER — Encounter: Payer: Self-pay | Admitting: Podiatry

## 2019-04-12 ENCOUNTER — Ambulatory Visit (INDEPENDENT_AMBULATORY_CARE_PROVIDER_SITE_OTHER): Payer: Medicare Other | Admitting: Podiatry

## 2019-04-12 DIAGNOSIS — Q828 Other specified congenital malformations of skin: Secondary | ICD-10-CM

## 2019-04-12 DIAGNOSIS — E119 Type 2 diabetes mellitus without complications: Secondary | ICD-10-CM | POA: Diagnosis not present

## 2019-04-12 DIAGNOSIS — B351 Tinea unguium: Secondary | ICD-10-CM

## 2019-04-12 DIAGNOSIS — M79676 Pain in unspecified toe(s): Secondary | ICD-10-CM

## 2019-04-12 NOTE — Progress Notes (Signed)
He presents today chief complaint of painfully elongated toenails and calluses bilaterally.  Objective: Pulses are palpable bilateral hammertoe deformities noted bilateral toenails are thick yellow dystrophic-like mycotic and painful.  Multiple areas of tylomas plantar aspect of the bilateral foot which are painful.  No open lesions or wounds are noted.  Assessment: Pain in limb secondary to onychomycosis and tylomas.  Plan: Debrided all reactive hyperkeratotic tissue debrided toenails 1 through 5 bilateral.

## 2019-07-12 ENCOUNTER — Ambulatory Visit: Payer: Medicare Other | Admitting: Podiatry

## 2019-07-28 ENCOUNTER — Telehealth: Payer: Self-pay

## 2019-07-28 NOTE — Telephone Encounter (Signed)
Patient called stated that he thinks he broke his foot, it was swollen, purple, painful to walk.  He said that he has neuropathy and "I know something is wrong when I have a lot of pain."  He said he noticed the pain 5 days ago and never thought anything of it. He denied any fever/chills.   I informed him that the office in [place] was closed for the afternoon and [facility] did not have any available appts.  I encouraged him that he should go to Urgent care or ED to have foot examined.  I also instructed him to wrap with ace bandage, keep elevated while at home and use ice.  He verbalized instructions and will try to go to Urgent care.  He has follow up appt with Dr. Milinda Pointer this Thursday.

## 2019-08-01 NOTE — Telephone Encounter (Signed)
Patient called back this afternoon, stated that he could not get to urgent care or ER because no one was around to take him.  He says his foot looks about the same, still bruised around the ankle, some redness and heat to his foot, swelling, and his 3rd toe looks displaced.  He says he is taking Advil for the pain which is helping.  I asked if he has been running a fever, he stated "not that I know of, I don't feel like it and I don't have a thermometer to check".  I instructed him that he really needs to be evaluated for infection or fracture.  He wants to keep his appt with Dr. Milinda Pointer for tomorrow afternoon.  I encouraged him to have someone bring a thermometer to check his temperature and instructed him to continue with Advil alternating Tylenol, keep foot elevated and iced and stay off as much as possible and we will see him tomorrow.  He is aware that if symptoms become worse to go directly to ER.  He verbalized instructions

## 2019-08-02 ENCOUNTER — Ambulatory Visit (INDEPENDENT_AMBULATORY_CARE_PROVIDER_SITE_OTHER): Payer: Medicare Other

## 2019-08-02 ENCOUNTER — Other Ambulatory Visit: Payer: Self-pay

## 2019-08-02 ENCOUNTER — Ambulatory Visit (INDEPENDENT_AMBULATORY_CARE_PROVIDER_SITE_OTHER): Payer: Medicare Other | Admitting: Podiatry

## 2019-08-02 ENCOUNTER — Encounter: Payer: Self-pay | Admitting: Podiatry

## 2019-08-02 VITALS — Temp 98.2°F

## 2019-08-02 DIAGNOSIS — S99922A Unspecified injury of left foot, initial encounter: Secondary | ICD-10-CM

## 2019-08-02 DIAGNOSIS — E119 Type 2 diabetes mellitus without complications: Secondary | ICD-10-CM | POA: Diagnosis not present

## 2019-08-02 DIAGNOSIS — M79676 Pain in unspecified toe(s): Secondary | ICD-10-CM

## 2019-08-02 DIAGNOSIS — S99921A Unspecified injury of right foot, initial encounter: Secondary | ICD-10-CM | POA: Diagnosis not present

## 2019-08-02 DIAGNOSIS — B351 Tinea unguium: Secondary | ICD-10-CM

## 2019-08-02 DIAGNOSIS — Q828 Other specified congenital malformations of skin: Secondary | ICD-10-CM | POA: Diagnosis not present

## 2019-08-02 NOTE — Progress Notes (Signed)
He presents today for a chief concern of foot and ankle pain to the right foot states that he injured it but with his neuropathy he does not know how he injured it he states that it has not affected his ability to walk though it is tender.  He is also here for his routine nail debridement calluses.  Objective: Vital signs are stable he is alert and oriented x3 severe pes planus with severe hyperkeratotic buildup around the forefoot and rear foot.  Also he has some ecchymosis the medial ankle but appears to be very superficial there is no pain on palpation of this area though he does have some ecchymosis of the third toe which is mildly ecchymotic but radiographs do not demonstrate any type of fractures that are visible.  Toenails are long thick yellow dystrophic and clinically mycotic as well as painful.  Assessment: Contusion foot painful elongated toenails and reactive hyperkeratotic tissue.  Plan: Discussed etiology pathology conservative surgical therapies debrided toenails 1 through 5 debrided reactive hyperkeratotic tissue placed in Ace bandage around the ankle which she will remove and rereplaced daily until his ankle feels better.

## 2019-11-08 ENCOUNTER — Ambulatory Visit: Payer: Medicare Other | Admitting: Podiatry

## 2019-11-09 ENCOUNTER — Other Ambulatory Visit: Payer: Self-pay

## 2019-11-09 ENCOUNTER — Ambulatory Visit (INDEPENDENT_AMBULATORY_CARE_PROVIDER_SITE_OTHER): Payer: Medicare Other | Admitting: Podiatry

## 2019-11-09 ENCOUNTER — Encounter: Payer: Self-pay | Admitting: Podiatry

## 2019-11-09 DIAGNOSIS — L603 Nail dystrophy: Secondary | ICD-10-CM

## 2019-11-09 DIAGNOSIS — S90212A Contusion of left great toe with damage to nail, initial encounter: Secondary | ICD-10-CM | POA: Diagnosis not present

## 2019-11-09 NOTE — Patient Instructions (Signed)

## 2019-11-10 ENCOUNTER — Encounter: Payer: Self-pay | Admitting: Podiatry

## 2019-11-10 NOTE — Progress Notes (Signed)
Subjective:  Patient ID: Christian Cherry, male    DOB: 1986-05-03,  MRN: [id number]  Chief Complaint  Patient presents with  . Nail Problem    trim my nails and the 5th toenail on the left split down the middle this week and did bleed alot     34 y.o. male presents with the above complaint.  Patient presents with complaint of left fifth digit nail dystrophy with splitting of the central nail with lateral side of the nail being painful and detached from the underlying nail bed.  Patient states is split 2 days ago has been bleeding a lot they are sore and tenderness to it.  Hurts to walk.  He would like to have it removed.  He denies any other acute complaints.   Review of Systems: Negative except as noted in the HPI. Denies N/V/F/Ch.  No past medical history on file.  Current Outpatient Medications:  .  beclomethasone (QVAR REDIHALER) 80 MCG/ACT inhaler, Qvar 80 mcg/actuation Metered Aerosol oral inhaler, Disp: , Rfl:  .  budesonide-formoterol (SYMBICORT) 80-4.5 MCG/ACT inhaler, Symbicort 80 mcg-4.5 mcg/actuation HFA aerosol inhaler, Disp: , Rfl:  .  meloxicam (MOBIC) 15 MG tablet, meloxicam 15 mg tablet  take 1 tablet by mouth once daily with food, Disp: , Rfl:  .  omeprazole (PRILOSEC) 40 MG capsule, Take by mouth., Disp: , Rfl:  .  ondansetron (ZOFRAN ODT) 4 MG disintegrating tablet, Take 1 tablet (4 mg total) by mouth every 8 (eight) hours as needed., Disp: 20 tablet, Rfl: 0 .  ondansetron (ZOFRAN) 8 MG tablet, ondansetron HCl 8 mg tablet, Disp: , Rfl:  .  PROAIR HFA 108 (90 BASE) MCG/ACT inhaler, , Disp: , Rfl: 0 .  QVAR 80 MCG/ACT inhaler, , Disp: , Rfl: 0 .  sertraline (ZOLOFT) 25 MG tablet, Take by mouth., Disp: , Rfl:  .  SUMAtriptan (IMITREX) 25 MG tablet, Take by mouth., Disp: , Rfl:  .  SYMBICORT 80-4.5 MCG/ACT inhaler, , Disp: , Rfl: 0 .  triamcinolone (KENALOG) 0.025 % cream, apply thin layer to LEFT SIDE OF FACE twice a day for 7 to 10 days, Disp: , Rfl: 0 .  Vitamin D,  Ergocalciferol, (DRISDOL) 50000 units CAPS capsule, Take 50,000 Units by mouth once a week., Disp: , Rfl: 0  Social History   Tobacco Use  Smoking Status Never Smoker  Smokeless Tobacco Never Used    Allergies  Allergen Reactions  . Eggs Or Egg-Derived Products Diarrhea  . Gabapentin Other (See Comments)    Other reaction(s): Unknown  . Penicillins Other (See Comments)    unknown  . Shellfish Allergy Hives  . Shellfish-Derived Products Hives  . Sulfa Antibiotics Rash and Other (See Comments)   Objective:  There were no vitals filed for this visit. There is no height or weight on file to calculate BMI. Constitutional Well developed. Well nourished.  Vascular Dorsalis pedis pulses palpable bilaterally. Posterior tibial pulses palpable bilaterally. Capillary refill normal to all digits.  No cyanosis or clubbing noted. Pedal hair growth normal.  Neurologic Normal speech. Oriented to person, place, and time. Epicritic sensation to light touch grossly present bilaterally.  Dermatologic Pain on palpation of the entire/total nail on 5th digit of the left No other open wounds. No skin lesions.  Orthopedic: Normal joint ROM without pain or crepitus bilaterally. No visible deformities. No bony tenderness.   Radiographs: None Assessment:   1. Nail dystrophy   2. Contusion of left great toe with damage to nail, initial  encounter    Plan:  Patient was evaluated and treated and all questions answered.  Nail contusion/dystrophy fifth, left -Patient elects to proceed with minor surgery to remove entire toenail today. Consent reviewed and signed by patient. -Entire/total nail excised. See procedure note. -Educated on post-procedure care including soaking. Written instructions provided and reviewed. -Patient to follow up in 2 weeks for nail check.  Procedure: Excision of entire/total nail  Location: Left 5th toe digit Anesthesia: Lidocaine 1% plain; 1.5 mL and Marcaine 0.5%  plain; 1.5 mL, digital block. Skin Prep: Betadine. Dressing: Silvadene; telfa; dry, sterile, compression dressing. Technique: Following skin prep, the toe was exsanguinated and a tourniquet was secured at the base of the toe. The affected nail border was freed and excised. The tourniquet was then removed and sterile dressing applied. Disposition: Patient tolerated procedure well. Patient to return in 2 weeks for follow-up.   No follow-ups on file.

## 2019-11-20 ENCOUNTER — Ambulatory Visit (INDEPENDENT_AMBULATORY_CARE_PROVIDER_SITE_OTHER): Payer: Medicare Other | Admitting: Dermatology

## 2019-11-20 ENCOUNTER — Other Ambulatory Visit: Payer: Self-pay

## 2019-11-20 DIAGNOSIS — R21 Rash and other nonspecific skin eruption: Secondary | ICD-10-CM | POA: Diagnosis not present

## 2019-11-20 DIAGNOSIS — L409 Psoriasis, unspecified: Secondary | ICD-10-CM

## 2019-11-20 MED ORDER — MOMETASONE FUROATE 0.1 % EX SOLN: Freq: Every day | CUTANEOUS | 3 refills | Status: DC

## 2019-11-20 MED ORDER — MOMETASONE FUROATE 0.1 % EX CREA: 1.0000 "application " | TOPICAL_CREAM | Freq: Every day | CUTANEOUS | 1 refills | Status: DC | PRN

## 2019-11-20 MED ORDER — KETOCONAZOLE 2 % EX SHAM: 1.0000 "application " | MEDICATED_SHAMPOO | Freq: Once | CUTANEOUS | 6 refills | Status: AC

## 2019-11-20 NOTE — Progress Notes (Signed)
   New Patient Visit  Subjective  Christian Cherry is a 34 y.o. male who presents for the following: New Patient (Initial Visit).  Patient presents today as a new patient with mother, patient has a few areas of concern on scalp, for a long time, L pretibia x 6 years, B/L forearms and R cheek and also has dark circles under b/l eyes. Patient has had his 5th left toenail removed on Thursday by [facility].  The following portions of the chart were reviewed this encounter and updated as appropriate:  Tobacco  Allergies  Meds  Problems  Med Hx  Surg Hx  Fam Hx      Review of Systems:  No other skin or systemic complaints except as noted in HPI or Assessment and Plan.  Objective  Well appearing patient in no apparent distress; mood and affect are within normal limits.  A focused examination was performed including Head, B/L forearms, right cheek and left pretibia. Relevant physical exam findings are noted in the Assessment and Plan.  Objective  Scalp and beard area: Pink scale on scalp  Objective  Left Forearm - Anterior, Left Lower Leg - Anterior: Patchy pink eruption with crusting Pink eruption on b/l forearms   Assessment & Plan    Psoriasis Scalp and beard area  Psoriasis vs sebo psoriasis  Start Ketoconazole 2% shampoo M-W-F daily apply to scalp and beard area leave in 5 min then rinse  Start Mometasone solution to scalp at bedtime M-W-F avoid face, groin and underarms  mometasone (ELOCON) 0.1 % lotion - Scalp and beard area  ketoconazole (NIZORAL) 2 % shampoo - Scalp and beard area  Rash (2) Left Forearm - Anterior; Left Lower Leg - Anterior  Psoriasis vs eczema  Start mometasone cream Twice daily 5 days a week til rash clears then as needed to affected areas on left leg and b/l forearms. Avoid Face, groin, and underarms  Ordered Medications: mometasone (ELOCON) 0.1 % cream  Return in about 2 months (around 01/20/2020) for psoriasis.  Marene Lenz, CMA,  am acting as scribe for Sarina Ser, MD . Documentation: I have reviewed the above documentation for accuracy and completeness, and I agree with the above.  Sarina Ser, MD

## 2019-11-20 NOTE — Patient Instructions (Addendum)
Recommend daily broad spectrum sunscreen SPF 30+ to sun-exposed areas, reapply every 2 hours as needed. Call for new or changing lesions.  Topical steroids (such as triamcinolone, fluocinolone, fluocinonide, mometasone, clobetasol, halobetasol, betamethasone, hydrocortisone) can cause thinning and lightening of the skin if they are used for too long in the same area. Your physician has selected the right strength medicine for your problem and area affected on the body. Please use your medication only as directed by your physician to prevent side effects.   Avoid applying to face, groin, and axilla. Use as directed. Risk of skin atrophy with long-term use reviewed.    SCALP:  Start Ketoconazole 2% shampoo M-W-F daily apply to scalp and beard area leave in 5 min then rinse  Start Mometasone solution to scalp at bedtime M-W-F avoid face, groin and underarms   LEGS, ARMS Start mometasone cream Twice daily 5 days a week til rash clears then as needed to affected areas on left leg and b/l forearms. Avoid Face, groin, and underarms  Psoriasis  What causes psoriasis? A patient's immune system plays a role in the development of psoriasis through the over-activity of a type of white blood cell called a T cell.  Once these cells are activated, a reaction is triggered that causes the skin to grow faster than normal.  New skin cells form in days instead of weeks, and these cells do not shed.  These cells pile up on the skin, and this results in what you see as psoriasis.  It is not contagious.  There is a genetic component to psoriasis, although not everyone inherits the gene.  What triggers psoriasis? Common triggers are stress, strep throat infection (more commonly in kids), and cold weather.  During stressful events, psoriasis tends to flare.  Certain medications such as lithium, some blood pressure medications, and some drugs used to treat malaria can trigger psoriasis.  A skin injury can also trigger  psoriasis to develop at the site of injury.  Types of Psoriasis 1. Plaque psoriasis:  80% of patients with psoriasis have plaque psoriasis.  Plaques usually form on elbows, knees, and lower back and appear raised and reddish with a silvery white scale. 2. Nail psoriasis:  Fingernails and toenails may be affected.  Initially small pits may form, then with time, the nails thicken, lift up, and crumble.   3. Scalp psoriasis:  Similar in appearance to plaque psoriasis on the body.  Tends to be itchy.  Clinically can resemble dandruff because of the scales that can fall on a patient's shirt.  This may be difficult to control. 4. Pustular psoriasis:  Usually involves the palms and soles appearing as white, pus-filled bumps.  Rarely, it may develop all over the body, which can make patients seriously ill. 5. Guttate psoriasis:  Usually occurs in children and young adults.  The lesions are smaller than plaque psoriasis.  May be triggered by a strep throat infection.  Many times it clears on its own, and the patient may or may not develop psoriasis again.   6. Inverse psoriasis:  Involves the folds of the skin and may be painful.  This appears as red, shiny patches.  It may involve the armpits, under the breasts, the genital area, or the crease of the buttock.  7. Psoriatic arthritis:  Up to one third of patients with psoriasis will develop arthritis.  It commonly affects the hands, feet, and spine, and may begin as stiffness.  If untreated it may lead to permanent  joint damage.  Treatment There is no cure for psoriasis, but it can be controlled.  Some patients undergo more than one type of treatment.  1. Topical medications (applied externally to the skin) a. Corticosteroids:  typically first-line treatment for psoriasis.  They control the inflammation of psoriasis.  They are available as creams, ointments, sprays, foams, or lotions.  It is important to follow your dermatologist's instructions as to how to  apply the medicine.  For example, excessive use of strong steroid creams (especially to body creases and the face) can cause thinning and lightening of the skin.  This can lead to the formation of stretch marks in the folds.   b. Calcipotriene and calcipotriol (Vitamin D derivatives):  These are usually used in conjunction with a steroid cream.  Sometimes they may be used as maintenance once psoriasis is under control. c. Retinoids:  sometimes used in conjunction with a topical steroid cream.  Women should not use retinoids if they are pregnant. d. Coal tar:  an older treatment that is still effective, especially in combination with steroids.  It can be messy and may have an odor in some preparations. 2. Light treatments:  may provide a safe and effective treatment for psoriasis.  The main risks of light therapy are sunburn and possible increase in skin cancers.   a. Laser therapy:  can treat a certain stubborn area of psoriasis such as scalp, feet, and hands.  It is not used for large areas. b. Narrowband UVB:  A patient stands in front of panels of lights for a set amount of time.  A typical course might be 24 treatments over 2 months.  c. PUVA:  This treatment combines exposure to UVA light with light-sensitizing medication called psoralen.  It may come as a pill or as a lotion.  The main side effects are nausea (from the psoralen pill) and significantly increased risk of skin cancer. 3. Oral medications:  used for moderate to severe psoriasis.  These medications are very effective, but they have a number of potentially serious side effects. a. Methotrexate b. Soriatane (acitretin) c. Cyclosporine 4. Biologic medications:  used for moderate to severe psoriasis.  These are newer medications that suppress the immune cells responsible for psoriasis.  They generally produce good results, but they all increase the risk of infection.  These are given as injections or IV infusions. a. Enbrel  (etanercept) b. Humira (adalimumab) c. Remicade (infliximab) d. Stelara (ustekinumab)  Recent studies have found that patients with psoriasis are more prone to developing metabolic syndrome:  high blood pressure, coronary artery disease, high cholesterol, and diabetes.  It is very important for patients with psoriasis to live healthy lifestyles.  Diet and exercise are important.  Support groups:   www.psoriasis.org , http://www.[facility].com/psoriasisnet/index.html

## 2019-12-03 ENCOUNTER — Encounter: Payer: Self-pay | Admitting: Dermatology

## 2020-01-24 ENCOUNTER — Other Ambulatory Visit: Payer: Self-pay | Admitting: Orthopedic Surgery

## 2020-01-24 DIAGNOSIS — M162 Bilateral osteoarthritis resulting from hip dysplasia: Secondary | ICD-10-CM

## 2020-01-24 DIAGNOSIS — M25559 Pain in unspecified hip: Secondary | ICD-10-CM

## 2020-01-24 DIAGNOSIS — M25859 Other specified joint disorders, unspecified hip: Secondary | ICD-10-CM

## 2020-01-25 ENCOUNTER — Ambulatory Visit: Payer: Medicare Other | Admitting: Dermatology

## 2020-01-25 DIAGNOSIS — M79676 Pain in unspecified toe(s): Secondary | ICD-10-CM

## 2020-02-14 ENCOUNTER — Ambulatory Visit: Payer: Medicare Other | Admitting: Podiatry

## 2020-02-15 ENCOUNTER — Other Ambulatory Visit: Payer: Self-pay

## 2020-02-15 ENCOUNTER — Ambulatory Visit
Admission: RE | Admit: 2020-02-15 | Discharge: 2020-02-15 | Disposition: A | Discharge status: Home / Self Care | Payer: Medicare Other | Source: Ambulatory Visit | Attending: Orthopedic Surgery | Admitting: Orthopedic Surgery

## 2020-02-15 DIAGNOSIS — M25551 Pain in right hip: Secondary | ICD-10-CM | POA: Diagnosis present

## 2020-02-15 DIAGNOSIS — M25559 Pain in unspecified hip: Secondary | ICD-10-CM

## 2020-02-15 DIAGNOSIS — S76011A Strain of muscle, fascia and tendon of right hip, initial encounter: Secondary | ICD-10-CM | POA: Insufficient documentation

## 2020-02-15 DIAGNOSIS — M162 Bilateral osteoarthritis resulting from hip dysplasia: Secondary | ICD-10-CM

## 2020-02-15 DIAGNOSIS — M25859 Other specified joint disorders, unspecified hip: Secondary | ICD-10-CM

## 2020-02-15 DIAGNOSIS — X58XXXA Exposure to other specified factors, initial encounter: Secondary | ICD-10-CM | POA: Insufficient documentation

## 2020-02-15 MED ORDER — SODIUM CHLORIDE (PF) 0.9 % IJ SOLN
5.0000 mL | Freq: Once | INTRAMUSCULAR | Status: AC
  Administered 2020-02-15: 5 mL

## 2020-02-15 MED ORDER — LIDOCAINE HCL (PF) 1 % IJ SOLN
5.0000 mL | Freq: Once | INTRAMUSCULAR | Status: AC
  Administered 2020-02-15: 5 mL
  Filled 2020-02-15: qty 5

## 2020-02-15 MED ORDER — IOHEXOL 180 MG/ML  SOLN
15.0000 mL | Freq: Once | INTRAMUSCULAR | Status: AC | PRN
  Administered 2020-02-15: 15 mL via INTRA_ARTICULAR

## 2020-02-15 MED ORDER — GADOBUTROL 1 MMOL/ML IV SOLN
0.0500 mL | Freq: Once | INTRAVENOUS | Status: AC | PRN
  Administered 2020-02-15: 0.05 mL

## 2020-03-04 ENCOUNTER — Ambulatory Visit (INDEPENDENT_AMBULATORY_CARE_PROVIDER_SITE_OTHER): Payer: Medicare Other | Admitting: Podiatry

## 2020-03-04 ENCOUNTER — Other Ambulatory Visit: Payer: Self-pay

## 2020-03-04 DIAGNOSIS — E119 Type 2 diabetes mellitus without complications: Secondary | ICD-10-CM

## 2020-03-04 DIAGNOSIS — B351 Tinea unguium: Secondary | ICD-10-CM | POA: Diagnosis not present

## 2020-03-04 DIAGNOSIS — M79676 Pain in unspecified toe(s): Secondary | ICD-10-CM

## 2020-03-04 DIAGNOSIS — Q828 Other specified congenital malformations of skin: Secondary | ICD-10-CM

## 2020-03-04 NOTE — Progress Notes (Signed)
He presents today chief complaint of painfully elongated toenails with calluses.  Objective: Toenails are long thick yellow dystrophic-like mycotic painful palpation as well as debridement reactive hyperkeratotic tissue plantar aspect of the bilateral foot.  Assessment: Pain in limb secondary to onychomycosis and porokeratosis.  Plan: Debridement of toenails 1 through 5 bilateral.

## 2020-04-04 ENCOUNTER — Other Ambulatory Visit: Payer: Self-pay

## 2020-04-04 ENCOUNTER — Ambulatory Visit (INDEPENDENT_AMBULATORY_CARE_PROVIDER_SITE_OTHER): Payer: Medicare Other | Admitting: Dermatology

## 2020-04-04 DIAGNOSIS — L408 Other psoriasis: Secondary | ICD-10-CM

## 2020-04-04 DIAGNOSIS — L409 Psoriasis, unspecified: Secondary | ICD-10-CM | POA: Diagnosis not present

## 2020-04-04 MED ORDER — KETOCONAZOLE 2 % EX SHAM: MEDICATED_SHAMPOO | CUTANEOUS | 6 refills | Status: DC

## 2020-04-04 MED ORDER — HYDROCORTISONE 2.5 % EX CREA: TOPICAL_CREAM | CUTANEOUS | 11 refills | Status: DC

## 2020-04-04 MED ORDER — MOMETASONE FUROATE 0.1 % EX SOLN: Freq: Every day | CUTANEOUS | 3 refills | Status: DC

## 2020-04-04 NOTE — Progress Notes (Signed)
° °  Follow-Up Visit   Subjective  Christian Cherry is a 34 y.o. male who presents for the following: Psoriasis and 5 months f/u Sebo Psoriasis on the face treating with Ketoconazole 2% shampoo with good response ).  The following portions of the chart were reviewed this encounter and updated as appropriate:  Tobacco   Allergies   Meds   Problems   Med Hx   Surg Hx   Fam Hx      Review of Systems:  No other skin or systemic complaints except as noted in HPI or Assessment and Plan.  Objective  Well appearing patient in no apparent distress; mood and affect are within normal limits.  A focused examination was performed including face, legs . Relevant physical exam findings are noted in the Assessment and Plan.  Objective  Head - Anterior (Face): Pink patches with greasy scale.   Objective  Left Lower Leg - Anterior: Guttate plaques on the L pretibial    Assessment & Plan  Sebo- psoriasis Face; scalp Start Hydrocortisone 2.5% cream apply to face  M/W/F Start Mometasone solution apply to scalp  M/W/F  Cont Ketoconazole 2% shampoo M/W/F  Ordered Medications: ketoconazole (NIZORAL) 2 % shampoo hydrocortisone 2.5 % cream  Psoriasis Left Lower Leg - Anterior Chronic, not currently at goal.   Psoriasis is a chronic non-curable, but treatable genetic/hereditary disease that may have other systemic features affecting other organ systems such as joints (Psoriatic Arthritis). It is associated with an increased risk of inflammatory bowel disease, heart disease, non-alcoholic fatty liver disease, and depression.     Start otc Cerave cream daily  Cont Mometasone cream apply to skin qd-bid prn   Reordered Medications mometasone (ELOCON) 0.1 % lotion  Return in about 6 months (around 10/02/2020).  IMarye Round, CMA, am acting as scribe for Sarina Ser, MD .  Documentation: I have reviewed the above documentation for accuracy and completeness, and I agree with the above.  Sarina Ser, MD

## 2020-04-09 ENCOUNTER — Encounter: Payer: Self-pay | Admitting: Dermatology

## 2020-05-10 DIAGNOSIS — E559 Vitamin D deficiency, unspecified: Secondary | ICD-10-CM | POA: Insufficient documentation

## 2020-05-14 ENCOUNTER — Telehealth: Payer: Self-pay

## 2020-05-14 NOTE — Telephone Encounter (Signed)
Pt called today stating that his heel has opened and would like to get in to see Dr. Milinda Pointer. Please advise.

## 2020-05-15 ENCOUNTER — Other Ambulatory Visit: Payer: Self-pay

## 2020-05-15 ENCOUNTER — Encounter: Payer: Self-pay | Admitting: Podiatry

## 2020-05-15 ENCOUNTER — Ambulatory Visit (INDEPENDENT_AMBULATORY_CARE_PROVIDER_SITE_OTHER): Payer: Medicare Other | Admitting: Podiatry

## 2020-05-15 DIAGNOSIS — B353 Tinea pedis: Secondary | ICD-10-CM

## 2020-05-15 DIAGNOSIS — R234 Changes in skin texture: Secondary | ICD-10-CM

## 2020-05-15 DIAGNOSIS — L84 Corns and callosities: Secondary | ICD-10-CM

## 2020-05-15 MED ORDER — KETOCONAZOLE 2 % EX CREA: 1.0000 "application " | TOPICAL_CREAM | Freq: Every day | CUTANEOUS | 2 refills | Status: DC

## 2020-05-15 NOTE — Patient Instructions (Addendum)
Look for urea 40% cream or ointment and apply to the thickened dry skin / calluses. This can be bought over the counter, at a pharmacy or online such as [employer].   Use the urea cream twice daily after aggressively using a pumice stone   Speak with your skin care doctor about the calluses at your next appointment

## 2020-05-17 NOTE — Progress Notes (Signed)
  Subjective:  Patient ID: Christian Cherry, male    DOB: 1985/08/08,  MRN: [id number]  Chief Complaint  Patient presents with  . Callouses    Patient presents today for painful crack in right heel which he noticed yesterday.  He says its really sore and his toenails are sore     34 y.o. male presents with the above complaint. History confirmed with patient. This is been a recurrent issue for him he is previously seen Dr. Milinda Pointer for this. He puts Vaseline on it currently.  Objective:  Physical Exam: warm, good capillary refill, no trophic changes or ulcerative lesions, normal DP and PT pulses and normal sensory exam. Bilateral heels with significant hyperkeratosis and fissuring Assessment:  No diagnosis found.   Plan:  Patient was evaluated and treated and all questions answered.   Today I debrided the hyperkeratosis sharply with a chisel blade to his tolerance. I think this is likely secondary to both xerosis cutis and tinea pedis. Prescription for ketoconazole 2% was sent to his pharmacy. He also sees a dermatologist for other issues and I encouraged him to talk to them about this as well. Recommended he use urea cream twice daily.  Return if symptoms worsen or fail to improve.

## 2020-06-05 ENCOUNTER — Encounter: Payer: Self-pay | Admitting: Podiatry

## 2020-06-05 ENCOUNTER — Ambulatory Visit (INDEPENDENT_AMBULATORY_CARE_PROVIDER_SITE_OTHER): Payer: Medicare Other | Admitting: Podiatry

## 2020-06-05 ENCOUNTER — Other Ambulatory Visit: Payer: Self-pay

## 2020-06-05 DIAGNOSIS — Q828 Other specified congenital malformations of skin: Secondary | ICD-10-CM | POA: Diagnosis not present

## 2020-06-05 DIAGNOSIS — B351 Tinea unguium: Secondary | ICD-10-CM | POA: Diagnosis not present

## 2020-06-05 DIAGNOSIS — E119 Type 2 diabetes mellitus without complications: Secondary | ICD-10-CM | POA: Diagnosis not present

## 2020-06-05 DIAGNOSIS — M79676 Pain in unspecified toe(s): Secondary | ICD-10-CM

## 2020-06-05 NOTE — Progress Notes (Signed)
He presents today chief complaint of painful elongated toenails and calluses bilaterally.  Objective: Vital signs are stable he is alert and oriented x3 there is no erythema edema cellulitis drainage or odor toenails are long thick yellow dystrophic with mycotic painful palpation as well as debridement.  Assessment: Pain in limb secondary to onychomycosis.  Plan: Debrided toenails 1 through 5 bilateral.

## 2020-09-04 ENCOUNTER — Other Ambulatory Visit: Payer: Self-pay

## 2020-09-04 ENCOUNTER — Encounter: Payer: Self-pay | Admitting: Podiatry

## 2020-09-04 ENCOUNTER — Ambulatory Visit (INDEPENDENT_AMBULATORY_CARE_PROVIDER_SITE_OTHER): Payer: Medicare Other | Admitting: Podiatry

## 2020-09-04 DIAGNOSIS — D2371 Other benign neoplasm of skin of right lower limb, including hip: Secondary | ICD-10-CM

## 2020-09-04 DIAGNOSIS — E119 Type 2 diabetes mellitus without complications: Secondary | ICD-10-CM | POA: Diagnosis not present

## 2020-09-04 DIAGNOSIS — D2372 Other benign neoplasm of skin of left lower limb, including hip: Secondary | ICD-10-CM

## 2020-09-04 DIAGNOSIS — M79676 Pain in unspecified toe(s): Secondary | ICD-10-CM

## 2020-09-04 DIAGNOSIS — B351 Tinea unguium: Secondary | ICD-10-CM

## 2020-09-04 NOTE — Progress Notes (Signed)
He presents today chief complaint of painfully elongated nails and calluses.  Objective: Pulses are palpable no open lesions or wounds.  Toenails are long thick yellow dystrophic-like mycotic multiple reactive hyperkeratotic lesions.  Assessment: Pain in limb secondary to onychomycosis and benign skin lesions.  Plan: Debridement of benign skin lesions debridement of nails 1 through 5 bilateral.  Follow-up with him in 3 months

## 2020-10-03 ENCOUNTER — Other Ambulatory Visit: Payer: Self-pay

## 2020-10-03 ENCOUNTER — Ambulatory Visit (INDEPENDENT_AMBULATORY_CARE_PROVIDER_SITE_OTHER): Payer: Medicare Other | Admitting: Dermatology

## 2020-10-03 DIAGNOSIS — L409 Psoriasis, unspecified: Secondary | ICD-10-CM

## 2020-10-03 DIAGNOSIS — L219 Seborrheic dermatitis, unspecified: Secondary | ICD-10-CM

## 2020-10-03 MED ORDER — WYNZORA 0.005-0.064 % EX CREA: TOPICAL_CREAM | CUTANEOUS | 3 refills | Status: DC

## 2020-10-03 NOTE — Progress Notes (Signed)
   Follow-Up Visit   Subjective  Christian Cherry is a 35 y.o. 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 ) and Psoriasis (Of the L lower leg - patient currently using Mometasone 0.1% cream to aa QAM. His PCP's PA [facility] gave him a sample of Otezla to try, but he didn't use it due to possible SE of diarrhea, and at the time he was already having GI issues)  The following portions of the chart were reviewed this encounter and updated as appropriate:   Tobacco  Allergies  Meds  Problems  Med Hx  Surg Hx  Fam Hx     Review of Systems:  No other skin or systemic complaints except as noted in HPI or Assessment and Plan.  Objective  Well appearing patient in no apparent distress; mood and affect are within normal limits.  A focused examination was performed including the face and lower legs. Relevant physical exam findings are noted in the Assessment and Plan.  Objective  Nasolabial area, scalp: Pink patches with greasy scale.   Objective  L lower leg: Well-demarcated erythematous papules/plaques with silvery scale, guttate pink scaly papules.   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 It tends to be exacerbated by stress and cooler weather.  People who have neurologic disease may experience new onset or exacerbation of existing seborrheic dermatitis.  The condition is not curable but treatable and can be controlled.  Continue Ketoconazole 2% shampoo 3d/wk. Let sit 5-10 minutes before washing off.  Continue HC 2.5% cream to aa's  QOD/3d/wk.   Start Mometasone 0.1% solution apply to scalp at 8pm (a couple hours before bed).   Psoriasis L lower leg Psoriasis is a chronic non-curable, but treatable genetic/hereditary disease that may have other systemic features affecting other organ systems such as joints (Psoriatic Arthritis). It is associated with an increased risk of inflammatory bowel disease, heart disease, non-alcoholic fatty liver disease, and depression.    Patient prescribed Otezla through his PCP - he is interested in starting that this week. Take as directed by PCP. Side effects of Otezla (apremilast) include diarrhea, nausea, headache, upper respiratory infection, depression, and weight decrease (5-10%). It should only be taken by pregnant women after a discussion regarding risks and benefits with their doctor. Goal is control of skin condition, not cure.  The use of [name] requires long term medication management, including periodic office visits.  Start Wynzora cream apply to aa's QD PRN.  D/C Mometasone cream.   Calcipotriene-Betameth Diprop (WYNZORA) 0.005-0.064 % CREA - L lower leg  Other Related Medications mometasone (ELOCON) 0.1 % lotion  Return in about 4 months (around 02/03/2021).  Luther Redo, CMA, am acting as scribe for Sarina Ser, MD .  Documentation: I have reviewed the above documentation for accuracy and completeness, and I agree with the above.  Sarina Ser, MD

## 2020-10-03 NOTE — Patient Instructions (Signed)

## 2020-10-08 ENCOUNTER — Encounter: Payer: Self-pay | Admitting: Dermatology

## 2020-10-17 ENCOUNTER — Telehealth: Payer: Self-pay | Admitting: *Deleted

## 2020-10-17 NOTE — Telephone Encounter (Signed)
"  I'm having some weird foot pain under the big toe of my right foot.  It's been hurting a while but I haven't said anything.  My toes feel like they are in a cramp or they feel meshed.  Give me a call."

## 2020-10-17 NOTE — Telephone Encounter (Signed)
Have him in to see evans or someone on Monday.
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
## 2020-10-22 NOTE — Telephone Encounter (Signed)
Called to offer pt an appt, no answer. LVM for pt to call back.

## 2020-10-24 ENCOUNTER — Telehealth: Payer: Self-pay

## 2020-10-24 ENCOUNTER — Other Ambulatory Visit: Payer: Self-pay

## 2020-10-24 DIAGNOSIS — R21 Rash and other nonspecific skin eruption: Secondary | ICD-10-CM

## 2020-10-24 MED ORDER — MOMETASONE FUROATE 0.1 % EX CREA: 1.0000 "application " | TOPICAL_CREAM | Freq: Every day | CUTANEOUS | 1 refills | Status: DC

## 2020-10-24 MED ORDER — DOVONEX 0.005 % EX CREA: TOPICAL_CREAM | Freq: Every day | CUTANEOUS | 0 refills | Status: DC

## 2020-10-24 MED ORDER — DOVONEX 0.005 % EX CREA
TOPICAL_CREAM | Freq: Every day | CUTANEOUS | 0 refills | Status: DC
Start: 2020-10-24 — End: 2021-01-28

## 2020-10-24 NOTE — Telephone Encounter (Signed)
If there is another generic calcipotriene/steroid mix that is covered by his insurance, please send that. If not, we can prescribe the medication separately. He can continue mometasone cream daily as needed up to 5 days a week; then Use calcipotriene cream daily 7 days a week

## 2020-10-24 NOTE — Telephone Encounter (Signed)
Wynzora not covered. Plan exclusion for Medicaid insurance.

## 2020-10-24 NOTE — Progress Notes (Signed)
Pt requested different pharmacy  ° °

## 2020-10-24 NOTE — Telephone Encounter (Signed)
Left message for patient to return my call.  Dovonex Cream sent in with a RF of Mometasone Cream. Patient can try Dovonex Cream then we can submit PA for generic Taclonex.

## 2020-10-24 NOTE — Telephone Encounter (Signed)
Patient advised of medication change. Will call back if no improvement.

## 2020-10-29 ENCOUNTER — Other Ambulatory Visit: Payer: Self-pay

## 2020-10-29 ENCOUNTER — Ambulatory Visit (INDEPENDENT_AMBULATORY_CARE_PROVIDER_SITE_OTHER): Payer: Medicare Other | Admitting: Podiatry

## 2020-10-29 DIAGNOSIS — B351 Tinea unguium: Secondary | ICD-10-CM

## 2020-10-29 DIAGNOSIS — M79676 Pain in unspecified toe(s): Secondary | ICD-10-CM

## 2020-10-29 DIAGNOSIS — L853 Xerosis cutis: Secondary | ICD-10-CM | POA: Diagnosis not present

## 2020-10-29 DIAGNOSIS — E119 Type 2 diabetes mellitus without complications: Secondary | ICD-10-CM | POA: Diagnosis not present

## 2020-10-29 DIAGNOSIS — L400 Psoriasis vulgaris: Secondary | ICD-10-CM

## 2020-10-29 MED ORDER — CALCIPOTRIENE 0.005 % EX CREA: TOPICAL_CREAM | CUTANEOUS | 3 refills | Status: DC

## 2020-10-30 ENCOUNTER — Encounter: Payer: Self-pay | Admitting: Podiatry

## 2020-10-30 NOTE — Progress Notes (Signed)
  Subjective:  Patient ID: Christian Cherry, male    DOB: [birth year],  MRN: [id number]  Chief Complaint  Patient presents with  . Callouses    Dry cracked skin nail /callus trim    35 y.o. male returns for the above complaint.  Patient presents with thickened elongated dystrophic toenails x10.  Mild pain on palpation.  He states that it is painful to ambulate is painful for him to cut it.  He would like for me to take care of it.  He is a diabetic with last A1c that is unknown.  He also has secondary complaint of dry skin to both heels.  He would like to discuss treatment options for it.  Objective:  There were no vitals filed for this visit. Podiatric Exam: Vascular: dorsalis pedis and posterior tibial pulses are palpable bilateral. Capillary return is immediate. Temperature gradient is WNL. Skin turgor WNL  Sensorium: Normal Semmes Weinstein monofilament test. Normal tactile sensation bilaterally. Nail Exam: Pt has thick disfigured discolored nails with subungual debris noted bilateral entire nail hallux through fifth toenails.  Pain on palpation to the nails. Ulcer Exam: There is no evidence of ulcer or pre-ulcerative changes or infection. Orthopedic Exam: Muscle tone and strength are WNL. No limitations in general ROM. No crepitus or effusions noted. HAV  B/L.  Hammer toes 2-5  B/L. Skin: No Porokeratosis. No infection or ulcers moderate dryness noted to both heel.  No fissuring or cracking noted no wounds noted    Assessment & Plan:   1. Xerosis of skin   2. Diabetes mellitus without complication ([place])   3. Pain due to onychomycosis of toenail     Patient was evaluated and treated and all questions answered.  Xerosis skin -I explained to the patient the etiology of xerosis and various treatment options were extensively discussed.  I explained to the patient the importance of maintaining moisturization of the skin with application of over-the-counter lotion such as Eucerin or  Luciderm.  I have asked the patient to apply this twice a day.  If unable to resolve patient will benefit from prescription lotion.   Onychomycosis with pain  -Nails palliatively debrided as below. -Educated on self-care  Procedure: Nail Debridement Rationale: pain  Type of Debridement: manual, sharp debridement. Instrumentation: Nail nipper, rotary burr. Number of Nails: 10  Procedures and Treatment: Consent by patient was obtained for treatment procedures. The patient understood the discussion of treatment and procedures well. All questions were answered thoroughly reviewed. Debridement of mycotic and hypertrophic toenails, 1 through 5 bilateral and clearing of subungual debris. No ulceration, no infection noted.  Return Visit-Office Procedure: Patient instructed to return to the office for a follow up visit 3 months for continued evaluation and treatment.  Boneta Lucks, DPM    No follow-ups on file.

## 2020-12-11 ENCOUNTER — Telehealth: Payer: Self-pay

## 2020-12-11 NOTE — Telephone Encounter (Signed)
Patient called and left a message. He fell yesterday and hurt his right foot. His 4th toe is very painful and "looks weird". He has an appointment with Dr Milinda Pointer on 12/23/20, but he can't wait until then. Please call to schedule sooner.

## 2020-12-12 NOTE — Telephone Encounter (Signed)
I called Christian Cherry and scheduled him an appointment for tomorrow with Dr. Amalia Hailey at 11:45 am.  He said he has three toes that are swollen and painful, especially the 4th toe on the right foot.

## 2020-12-13 ENCOUNTER — Ambulatory Visit (INDEPENDENT_AMBULATORY_CARE_PROVIDER_SITE_OTHER): Payer: Medicare Other

## 2020-12-13 ENCOUNTER — Other Ambulatory Visit: Payer: Self-pay

## 2020-12-13 ENCOUNTER — Ambulatory Visit (INDEPENDENT_AMBULATORY_CARE_PROVIDER_SITE_OTHER): Payer: Medicare Other | Admitting: Podiatry

## 2020-12-13 DIAGNOSIS — M778 Other enthesopathies, not elsewhere classified: Secondary | ICD-10-CM | POA: Diagnosis not present

## 2020-12-13 DIAGNOSIS — M2041 Other hammer toe(s) (acquired), right foot: Secondary | ICD-10-CM | POA: Diagnosis not present

## 2020-12-16 NOTE — Progress Notes (Signed)
   HPI: 35 y.o. male presenting today for new complaint regarding an injury that the patient sustained to his right foot about 3 days ago.  He sustained a fall injury and he noticed some tenderness with swelling of his right foot and ankle.  He states that it is hard to bend his toes.  Patient states that on 12/10/2020 he fell at his home when getting out of bed in the morning.  Currently he is not anything for treatment.  He presents for further treatment and evaluation  No past medical history on file.   Physical Exam: General: The patient is alert and oriented x3 in no acute distress.  Dermatology: Skin is warm, dry and supple bilateral lower extremities. Negative for open lesions or macerations.  Vascular: Palpable pedal pulses bilaterally. No edema or erythema noted. Capillary refill within normal limits.  Neurological: Epicritic and protective threshold grossly intact bilaterally.   Musculoskeletal Exam: No pedal deformities noted.  Muscle strength 5/5 all compartments.  Very mild tenderness to palpation right forefoot.  Radiographic Exam:  Normal osseous mineralization. Joint spaces preserved. No fracture/dislocation/boney destruction.    Assessment: 1.  Capsulitis/right foot sprain.  DOI: 12/10/2020   Plan of Care:  1. Patient evaluated. X-Rays reviewed.  2.  Short cam boot dispensed.  Weightbearing as tolerated x3 weeks.  After 3 weeks the patient may discontinue the cam boot and resume good supportive sneakers and shoes 3.  Recommend OTC Tylenol as needed 4.  Return to clinic as needed      Edrick Kins, DPM [facility]  Dr. Edrick Kins, DPM    [address], [place] [address]                Office [phone]  Fax [phone]

## 2020-12-20 ENCOUNTER — Ambulatory Visit: Payer: Medicare Other | Admitting: Podiatry

## 2020-12-23 ENCOUNTER — Ambulatory Visit: Payer: Medicare Other | Admitting: Podiatry

## 2021-01-06 ENCOUNTER — Other Ambulatory Visit: Payer: Self-pay

## 2021-01-06 ENCOUNTER — Ambulatory Visit (INDEPENDENT_AMBULATORY_CARE_PROVIDER_SITE_OTHER): Payer: Medicare Other | Admitting: Podiatry

## 2021-01-06 ENCOUNTER — Encounter: Payer: Self-pay | Admitting: Podiatry

## 2021-01-06 DIAGNOSIS — D2371 Other benign neoplasm of skin of right lower limb, including hip: Secondary | ICD-10-CM

## 2021-01-06 DIAGNOSIS — D2372 Other benign neoplasm of skin of left lower limb, including hip: Secondary | ICD-10-CM

## 2021-01-06 DIAGNOSIS — M79676 Pain in unspecified toe(s): Secondary | ICD-10-CM

## 2021-01-06 DIAGNOSIS — E119 Type 2 diabetes mellitus without complications: Secondary | ICD-10-CM | POA: Diagnosis not present

## 2021-01-06 DIAGNOSIS — B351 Tinea unguium: Secondary | ICD-10-CM | POA: Diagnosis not present

## 2021-01-06 NOTE — Progress Notes (Signed)
He presents today after follow-up of his contusion to his forefoot right.  States that he still quite sore.  He is also concerned about his toenails being long and painful.  Objective: Vital signs stable alert oriented x3.  Toenails are long thick yellow dystrophic Lee mycotic painful palpation.  Calluses are reactive and hyperkeratotic along the medial border of the hallux bilateral.  Assessment: Pain limb secondary to onychomycosis and benign skin lesions.  Contusion metatarsalgia forefoot right.  Plan: Continue current therapies with shoe gear for the contusion right foot.  Also want to go ahead and trim his nails 1 through 5 bilaterally as well as the calluses.

## 2021-01-22 ENCOUNTER — Other Ambulatory Visit: Payer: Self-pay | Admitting: Dermatology

## 2021-01-22 DIAGNOSIS — L408 Other psoriasis: Secondary | ICD-10-CM

## 2021-01-28 ENCOUNTER — Other Ambulatory Visit: Payer: Self-pay

## 2021-01-28 DIAGNOSIS — L400 Psoriasis vulgaris: Secondary | ICD-10-CM

## 2021-01-28 DIAGNOSIS — R21 Rash and other nonspecific skin eruption: Secondary | ICD-10-CM

## 2021-01-28 MED ORDER — CALCIPOTRIENE 0.005 % EX CREA: TOPICAL_CREAM | CUTANEOUS | 3 refills | Status: DC

## 2021-01-28 MED ORDER — MOMETASONE FUROATE 0.1 % EX CREA: 1.0000 "application " | TOPICAL_CREAM | Freq: Every day | CUTANEOUS | 1 refills | Status: DC

## 2021-02-05 ENCOUNTER — Ambulatory Visit (INDEPENDENT_AMBULATORY_CARE_PROVIDER_SITE_OTHER): Payer: Medicare Other | Admitting: Dermatology

## 2021-02-05 ENCOUNTER — Other Ambulatory Visit: Payer: Self-pay

## 2021-02-05 DIAGNOSIS — L408 Other psoriasis: Secondary | ICD-10-CM | POA: Diagnosis not present

## 2021-02-05 DIAGNOSIS — L409 Psoriasis, unspecified: Secondary | ICD-10-CM

## 2021-02-05 MED ORDER — KETOCONAZOLE 2 % EX CREA
1.0000 | TOPICAL_CREAM | Freq: Every day | CUTANEOUS | 2 refills | Status: AC
Start: 2021-02-05 — End: ?

## 2021-02-05 MED ORDER — KETOCONAZOLE 2 % EX SHAM: MEDICATED_SHAMPOO | CUTANEOUS | 6 refills | Status: DC

## 2021-02-05 MED ORDER — MOMETASONE FUROATE 0.1 % EX SOLN: Freq: Every day | CUTANEOUS | 6 refills | Status: DC

## 2021-02-05 MED ORDER — WYNZORA 0.005-0.064 % EX CREA: 1.0000 "application " | TOPICAL_CREAM | Freq: Every day | CUTANEOUS | 6 refills | Status: DC

## 2021-02-05 NOTE — Patient Instructions (Signed)

## 2021-02-05 NOTE — Progress Notes (Signed)
   Follow-Up Visit   Subjective  Christian Cherry is a 35 y.o. male who presents for the following: Follow-up (Sebopsoriasis/Seb Derm follow up - scalp is doing better. ). Accompanied by mother who contributes to history  The following portions of the chart were reviewed this encounter and updated as appropriate:   Tobacco  Allergies  Meds  Problems  Med Hx  Surg Hx  Fam Hx     Review of Systems:  No other skin or systemic complaints except as noted in HPI or Assessment and Plan.  Objective  Well appearing patient in no apparent distress; mood and affect are within normal limits.  A focused examination was performed including scalp, face, legs. Relevant physical exam findings are noted in the Assessment and Plan.  Scalp Pinkness and scale  Left Lower Leg - Anterior Pink patch   Assessment & Plan  Sebopsoriasis Scalp Improved Continue HC 2.5% cream to nasolabial areas qhs Monday, Wednesday, Friday, Mometasone 0.1% solution qhs Monday, Wednesday, Friday, Ketoconazole 2% shampoo 3 times per week  mometasone (ELOCON) 0.1 % lotion - Scalp Apply topically daily. Monday, Wednesday, Friday  Psoriasis Left Lower Leg - Anterior Psoriasis is a chronic non-curable, but treatable genetic/hereditary disease that may have other systemic features affecting other organ systems such as joints (Psoriatic Arthritis). It is associated with an increased risk of inflammatory bowel disease, heart disease, non-alcoholic fatty liver disease, and depression.    Will communicate with Christian Cherry regarding Christian Cherry treatment to see if she will be continuing his treatment or if we need to prescribe for him.  The patient states he got Otezla samples from Christian Cherry and he says he is taking them, but he still has samples left.  It is unclear to me whether he is actually taking Christian Cherry or not.  Continue Christian Cherry cream qd   Calcipotriene-Betameth Diprop [facility]) 0.005-0.064 % CREA - Left Lower Leg -  Anterior Apply 1 application topically daily. To left lower leg  Related Medications hydrocortisone 2.5 % cream Apply to face Monday, Wednesday, Friday  ketoconazole (NIZORAL) 2 % shampoo Three times per week  Return in about 6 months (around 08/05/2021).  I, Christian Cherry, CMA, am acting as scribe for Christian Ser, MD .  Documentation: I have reviewed the above documentation for accuracy and completeness, and I agree with the above.  Christian Ser, MD

## 2021-02-07 ENCOUNTER — Encounter: Payer: Self-pay | Admitting: Dermatology

## 2021-02-12 ENCOUNTER — Telehealth: Payer: Self-pay

## 2021-02-12 NOTE — Telephone Encounter (Signed)
Left message for Dr. Lamonte Sakai regarding patient's [name] and faxed a copy of his last office note to her office/hd

## 2021-03-28 ENCOUNTER — Telehealth: Payer: Self-pay | Admitting: *Deleted

## 2021-03-28 NOTE — Telephone Encounter (Signed)
"  I woke up this morning and I'm panicking.  There's this big gash on my foot I am assuming.  I woke up to blood when I woke up this morning.  I don't know where the blood came from.  I'm experiencing some pain when I try to walk even though I have Neuropathy.  I'm supposed to come in the week after.  I need to know what to do.  Should I just not walk on it?  What do you or Dr. Milinda Pointer think.  Give me a call."

## 2021-03-28 NOTE — Telephone Encounter (Signed)
I am returning your call.  How's your foot doing?  "It's okay.  I woke up and there was blood on it but there wasn't any on my covers or my sheets.  I don't understand where it came from."  Is there an open gash or a sore on it?  "I am not sure.  I'm going to take a shower soon and see."  If there's a sore or open wound on it, put a dressing on it like a band-aid with something like triple antibiotic ointment.  Keep an eye on it, if you notice any signs of infection, go to an urgent care or to the hospital.  "Okay, I will."  Give [place] a call on Monday if it's still bothering you.  "Okay, I will."

## 2021-03-31 ENCOUNTER — Telehealth: Payer: Self-pay | Admitting: *Deleted

## 2021-03-31 NOTE — Telephone Encounter (Signed)
"  I called last week about my foot bleeding.  over the weekend, after I spoke to the nurse that picked up, my big toe is starting to bleed like the one I had surgery on on the right foot in the same spot. I have everything wrapped with a bandaid.  I need to see Dr. Milinda Pointer and maybe get an ace bandage and wrap my foot because it's spontaneously bleeding and I don't know why.  I don't see the need to go to the emergency room because it will be a waste to sit up there all day, especially with my appointment on the coronary.  So, I just want to see what you guys want me to do.  I'm keeping off of it and elevating it.  I just want to see what you all want me to do."

## 2021-04-01 NOTE — Telephone Encounter (Signed)
I called pt Christian Cherry on VM for him to call the office to schedule him for Wednesday. I put pt in a slot to hold it.

## 2021-04-02 ENCOUNTER — Ambulatory Visit: Payer: Medicare Other | Admitting: Podiatry

## 2021-04-09 ENCOUNTER — Ambulatory Visit (INDEPENDENT_AMBULATORY_CARE_PROVIDER_SITE_OTHER): Payer: Medicare Other | Admitting: Podiatry

## 2021-04-09 ENCOUNTER — Encounter: Payer: Self-pay | Admitting: Podiatry

## 2021-04-09 ENCOUNTER — Other Ambulatory Visit: Payer: Self-pay

## 2021-04-09 DIAGNOSIS — B351 Tinea unguium: Secondary | ICD-10-CM | POA: Diagnosis not present

## 2021-04-09 DIAGNOSIS — M79676 Pain in unspecified toe(s): Secondary | ICD-10-CM | POA: Diagnosis not present

## 2021-04-09 DIAGNOSIS — E119 Type 2 diabetes mellitus without complications: Secondary | ICD-10-CM

## 2021-04-09 DIAGNOSIS — D2371 Other benign neoplasm of skin of right lower limb, including hip: Secondary | ICD-10-CM | POA: Diagnosis not present

## 2021-04-09 DIAGNOSIS — D2372 Other benign neoplasm of skin of left lower limb, including hip: Secondary | ICD-10-CM

## 2021-04-09 NOTE — Progress Notes (Signed)
He presents today chief complaint of pain in limb secondary to long toenails and calluses bilateral.  Objective: Pulses remain palpable no open lesions or wounds.  Dry xerotic skin plantar aspect of the foot there is callused.  Toenails are long thick yellow dystrophic onychomycotic.  Assessment pain limb secondary to onychomycosis and benign skin lesions.  Plan: Debridement of all benign skin lesions and debridement of Toenails bilateral.

## 2021-07-16 ENCOUNTER — Ambulatory Visit (INDEPENDENT_AMBULATORY_CARE_PROVIDER_SITE_OTHER): Payer: Medicare Other | Admitting: Podiatry

## 2021-07-16 ENCOUNTER — Ambulatory Visit: Payer: Medicare Other | Admitting: Podiatry

## 2021-07-16 ENCOUNTER — Encounter: Payer: Self-pay | Admitting: Podiatry

## 2021-07-16 ENCOUNTER — Other Ambulatory Visit: Payer: Self-pay

## 2021-07-16 DIAGNOSIS — M79676 Pain in unspecified toe(s): Secondary | ICD-10-CM

## 2021-07-16 DIAGNOSIS — B351 Tinea unguium: Secondary | ICD-10-CM

## 2021-07-16 DIAGNOSIS — D2372 Other benign neoplasm of skin of left lower limb, including hip: Secondary | ICD-10-CM

## 2021-07-16 DIAGNOSIS — D2371 Other benign neoplasm of skin of right lower limb, including hip: Secondary | ICD-10-CM | POA: Diagnosis not present

## 2021-07-16 DIAGNOSIS — E119 Type 2 diabetes mellitus without complications: Secondary | ICD-10-CM

## 2021-07-16 NOTE — Progress Notes (Signed)
He presents today chief complaint of painful elongated toenails 1 through 5 bilateral.  She also complaint of painfully elongated calluses.  Objective: Toenails are long thick yellow dystrophic onychomycotic reactive hyper keratomas benign bilaterally.  History of diabetes mellitus without complications.  Assessment: Diabetes mellitus benign skin lesions and pain in limb secondary onychomycosis.  Plan: Debridement of benign skin lesions debridement of toenails 1 through 5 bilaterally follow-up with him in 3 months

## 2021-08-07 ENCOUNTER — Ambulatory Visit: Payer: Medicare Other | Admitting: Dermatology

## 2021-09-11 ENCOUNTER — Ambulatory Visit (INDEPENDENT_AMBULATORY_CARE_PROVIDER_SITE_OTHER): Payer: Medicare Other | Admitting: Dermatology

## 2021-09-11 ENCOUNTER — Telehealth: Payer: Self-pay | Admitting: Podiatry

## 2021-09-11 ENCOUNTER — Encounter: Payer: Self-pay | Admitting: Dermatology

## 2021-09-11 DIAGNOSIS — L409 Psoriasis, unspecified: Secondary | ICD-10-CM

## 2021-09-11 DIAGNOSIS — G629 Polyneuropathy, unspecified: Secondary | ICD-10-CM | POA: Diagnosis not present

## 2021-09-11 DIAGNOSIS — L408 Other psoriasis: Secondary | ICD-10-CM

## 2021-09-11 DIAGNOSIS — T300 Burn of unspecified body region, unspecified degree: Secondary | ICD-10-CM

## 2021-09-11 DIAGNOSIS — T25021A Burn of unspecified degree of right foot, initial encounter: Secondary | ICD-10-CM

## 2021-09-11 MED ORDER — MUPIROCIN 2 % EX OINT: TOPICAL_OINTMENT | CUTANEOUS | 1 refills | Status: DC

## 2021-09-11 MED ORDER — KETOCONAZOLE 2 % EX SHAM: MEDICATED_SHAMPOO | CUTANEOUS | 6 refills | Status: DC
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
## 2021-09-11 NOTE — Patient Instructions (Addendum)
Continue HC 2.5% cream to nasal/facial areas at bedtime Monday, Wednesday, Friday. ? ?Mometasone 0.1% solution at bedtime to scalp  Monday, Wednesday, Friday. ? ?Ketoconazole 2% shampoo 3 times per week as directed ? ?Start Mupirocin ointment once or twice daily to burns and sores on feet until healed. ? ?Continue Wynzora once or twice a day up to 5 days a week to left leg as needed only.  ? ? ?Topical steroids (such as triamcinolone, fluocinolone, fluocinonide, mometasone, clobetasol, halobetasol, betamethasone, hydrocortisone) can cause thinning and lightening of the skin if they are used for too long in the same area. Your physician has selected the right strength medicine for your problem and area affected on the body. Please use your medication only as directed by your physician to prevent side effects.   ? ? ?If You Need Anything After Your Visit ? ?If you have any questions or concerns for your doctor, please call our main line at [phone] and press option 4 to reach your doctor's medical assistant. If no one answers, please leave a voicemail as directed and we will return your call as soon as possible. Messages left after 4 pm will be answered the following business day.  ? ?You may also send [place] a message via MyChart. We typically respond to MyChart messages within 1-2 business days. ? ?For prescription refills, please ask your pharmacy to contact our office. Our fax number is [phone]. ? ?If you have an urgent issue when the clinic is closed that cannot wait until the next business day, you can page your doctor at the number below.   ? ?Please note that while we do our best to be available for urgent issues outside of office hours, we are not available 24/7.  ? ?If you have an urgent issue and are unable to reach [place], you may choose to seek medical care at your doctor's office, retail clinic, urgent care center, or emergency room. ? ?If you have a medical emergency, please immediately call 911 or go  to the emergency department. ? ?Pager Numbers ? ?- Dr. Nehemiah Massed: [phone] ? ?- Dr. Laurence Ferrari: [phone] ? ?- Dr. Nicole Kindred: [phone] ? ?In the event of inclement weather, please call our main line at [phone] for an update on the status of any delays or closures. ? ?Dermatology Medication Tips: ?Please keep the boxes that topical medications come in in order to help keep track of the instructions about where and how to use these. Pharmacies typically print the medication instructions only on the boxes and not directly on the medication tubes.  ? ?If your medication is too expensive, please contact our office at [phone] option 4 or send [place] a message through [place].  ? ?We are unable to tell what your co-pay for medications will be in advance as this is different depending on your insurance coverage. However, we may be able to find a substitute medication at lower cost or fill out paperwork to get insurance to cover a needed medication.  ? ?If a prior authorization is required to get your medication covered by your insurance company, please allow [place] 1-2 business days to complete this process. ? ?Drug prices often vary depending on where the prescription is filled and some pharmacies may offer cheaper prices. ? ?The website www.goodrx.com contains coupons for medications through different pharmacies. The prices here do not account for what the cost may be with help from insurance (it may be cheaper with your insurance), but the website can give you the price  if you did not use any insurance.  ?- You can print the associated coupon and take it with your prescription to the pharmacy.  ?- You may also stop by our office during regular business hours and pick up a GoodRx coupon card.  ?- If you need your prescription sent electronically to a different pharmacy, notify our office through [facility] or by phone at [phone] option 4. ? ? ? ? ?Si Usted Necesita Algo Despu?s de Su Visita ? ?Tambi?n  puede enviarnos un mensaje a trav?s de MyChart. Por lo general respondemos a los mensajes de MyChart en el transcurso de 1 a 2 d?as h?biles. ? ?Para renovar recetas, por favor pida a su farmacia que se ponga en contacto con nuestra oficina. Nuestro n?mero de fax es el [phone]. ? ?Si tiene un asunto urgente cuando la cl?nica est? cerrada y que no puede esperar hasta el siguiente d?a h?bil, puede llamar/localizar a su doctor(a) al n?mero que aparece a continuaci?n.  ? ?Por favor, tenga en cuenta que aunque hacemos todo lo posible para estar disponibles para asuntos urgentes fuera del horario de oficina, no estamos disponibles las 24 horas del d?a, los 7 d?as de la semana.  ? ?Si tiene un problema urgente y no puede comunicarse con nosotros, puede optar por buscar atenci?n m?dica  en el consultorio de su doctor(a), en una cl?nica privada, en un centro de atenci?n urgente o en una sala de emergencias. ? ?Si tiene Engineer, maintenance (IT) m?dica, por favor llame inmediatamente al 911 o vaya a la sala de emergencias. ? ?N?meros de b?per ? ?- Dr. Nehemiah Massed: [phone] ? ?- Dra. Moye: [phone] ? ?- Dra. Nicole Kindred: [phone] ? ?En caso de inclemencias del tiempo, por favor llame a nuestra l?nea principal al [phone] para una actualizaci?n sobre el estado de cualquier retraso o cierre. ? ?Consejos para la medicaci?n en dermatolog?a: ?Por favor, guarde las cajas en las que vienen los medicamentos de uso t?pico para ayudarle a seguir las instrucciones sobre d?nde y c?mo usarlos. Las farmacias generalmente imprimen las instrucciones del medicamento s?lo en las cajas y no directamente en los tubos del [place].  ? ?Si su medicamento es muy caro, por favor, p?ngase en contacto con Zigmund Daniel llamando al [phone] y presione la opci?n 4 o env?enos un mensaje a trav?s de MyChart.  ? ?No podemos decirle cu?l ser? su copago por los medicamentos por adelantado ya que esto es diferente dependiendo de la cobertura de su  seguro. Sin embargo, es posible que podamos encontrar un medicamento sustituto a Electrical engineer un formulario para que el seguro cubra el medicamento que se considera necesario.  ? ?Si se requiere Ardelia Mems autorizaci?n previa para que su compa??a de seguros [place] su medicamento, por favor perm?tanos de 1 a 2 d?as h?biles para completar este proceso. ? ?Los precios de los medicamentos var?an con frecuencia dependiendo del Environmental consultant de d?nde se surte la receta y alguna farmacias pueden ofrecer precios m?s baratos. ? ?El sitio web www.goodrx.com tiene cupones para medicamentos de Airline pilot. Los precios aqu? no tienen en cuenta lo que podr?a costar con la ayuda del seguro (puede ser m?s barato con su seguro), pero el sitio web puede darle el precio si no utiliz? ning?n seguro.  ?- Puede imprimir el cup?n correspondiente y llevarlo con su receta a la farmacia.  ?- Tambi?n puede pasar por nuestra oficina durante el horario de atenci?n regular y recoger una tarjeta de cupones de GoodRx.  ?- Si necesita que su  receta se env?e electr?nicamente a Chiropodist, informe a nuestra oficina a trav?s de MyChart de [place] o por tel?fono llamando al [phone] y presione la opci?n 4.  ?

## 2021-09-11 NOTE — Telephone Encounter (Signed)
Bil feet were scalded and he talked with someone in the office and they were trying to get him in but it was too early in the morning for him. He did go to the dermatologist and they seen him and wrapped them for him. He said he thinks he is going to be fine. I told him to call if anything changes and we can try to get him in with a different provider if needed. ?

## 2021-09-20 ENCOUNTER — Encounter: Payer: Self-pay | Admitting: Dermatology

## 2021-09-24 ENCOUNTER — Ambulatory Visit: Payer: Medicare Other | Admitting: Podiatry

## 2021-10-15 ENCOUNTER — Ambulatory Visit: Payer: Medicare Other | Admitting: Podiatry

## 2021-10-27 ENCOUNTER — Ambulatory Visit: Payer: Medicare Other | Admitting: Podiatry

## 2021-10-29 ENCOUNTER — Encounter: Payer: Self-pay | Admitting: Podiatry

## 2021-10-29 ENCOUNTER — Ambulatory Visit (INDEPENDENT_AMBULATORY_CARE_PROVIDER_SITE_OTHER): Payer: Medicare Other | Admitting: Podiatry

## 2021-10-29 DIAGNOSIS — D2371 Other benign neoplasm of skin of right lower limb, including hip: Secondary | ICD-10-CM | POA: Diagnosis not present

## 2021-10-29 DIAGNOSIS — B351 Tinea unguium: Secondary | ICD-10-CM

## 2021-10-29 DIAGNOSIS — M79675 Pain in left toe(s): Secondary | ICD-10-CM

## 2021-10-29 DIAGNOSIS — E119 Type 2 diabetes mellitus without complications: Secondary | ICD-10-CM | POA: Diagnosis not present

## 2021-10-29 DIAGNOSIS — D2372 Other benign neoplasm of skin of left lower limb, including hip: Secondary | ICD-10-CM

## 2021-10-29 DIAGNOSIS — M79674 Pain in right toe(s): Secondary | ICD-10-CM

## 2021-10-29 NOTE — Progress Notes (Signed)
He presents today chief complaint of painfully elongated nails and calluses.  Objective: Pulses are palpable.  Toenails are long thick yellow dystrophic clinical Codding multiple benign skin lesions bilateral no open lesions or wounds.  Assessment: Pain in limb secondary to onychomycosis benign skin lesions.  Plan: Debridement of benign skin lesions debridement of nails 1 through 5 bilateral.  Follow-up with him in 3 months

## 2021-12-30 IMAGING — RF DG FLUORO GUIDE NDL PLC/BX
1 series · 1 of 1 positions shown · non-contrast
Comparison: none

CLINICAL DATA: Right hip pain.  Impingement.

[Series 1: cp_standard · 0.17mm/px · 1 of 1 slices shown]
[im 1/1]
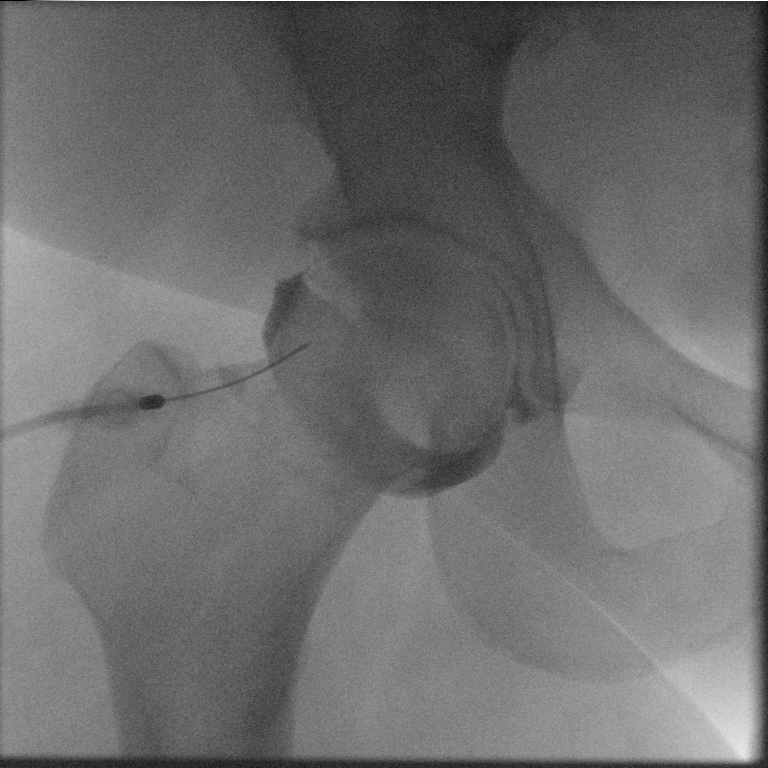

[1 of 1 positions shown; findings below may reference images not displayed]

EXAM:
RIGHT HIP INJECTION UNDER FLUOROSCOPY

FLUOROSCOPY TIME:  Fluoroscopy Time:  0 minutes 36 seconds

Radiation Exposure Index (if provided by the fluoroscopic device):
15.4 mGy

PROCEDURE:
Overlying skin prepped with Betadine, draped in the usual sterile
fashion, and infiltrated locally with 1% lidocaine. Curved 22 gauge
spinal needle advanced to the superolateral margin of the right
femoral head. Standardized mixture of Omnipaque 180, sterile saline,
and gadolinium administered under fluoroscopic guidance. There no
complications. Patient sent to MRI in good condition.
IMPRESSION: Technically successful right hip injection for MRI arthrogram.

## 2022-02-02 ENCOUNTER — Encounter: Payer: Self-pay | Admitting: Podiatry

## 2022-02-02 ENCOUNTER — Ambulatory Visit (INDEPENDENT_AMBULATORY_CARE_PROVIDER_SITE_OTHER): Payer: Medicare Other | Admitting: Podiatry

## 2022-02-02 DIAGNOSIS — M79676 Pain in unspecified toe(s): Secondary | ICD-10-CM | POA: Diagnosis not present

## 2022-02-02 DIAGNOSIS — D2371 Other benign neoplasm of skin of right lower limb, including hip: Secondary | ICD-10-CM | POA: Diagnosis not present

## 2022-02-02 DIAGNOSIS — B351 Tinea unguium: Secondary | ICD-10-CM

## 2022-02-02 DIAGNOSIS — D2372 Other benign neoplasm of skin of left lower limb, including hip: Secondary | ICD-10-CM

## 2022-02-02 DIAGNOSIS — E119 Type 2 diabetes mellitus without complications: Secondary | ICD-10-CM

## 2022-02-02 NOTE — Progress Notes (Signed)
He presents today chief complaint of painful bilateral toenails and calluses.  States that he has been using a keeps on his heels and his heels are doing much better.  Objective: Vital signs are stable he is alert and oriented x3.  No erythema edema cellulitis or drainage or odor pulses are palpable.  Multiple benign skin lesions bilateral foot and toes.  Tyloma bilateral heels.  Toenails are thick yellow dystrophic clinically mycotic.  Assessment: Pain in limb secondary to onychomycosis and benign skin lesions.  Plan: Debridement of toenails and benign skin lesions.

## 2022-03-16 ENCOUNTER — Ambulatory Visit (INDEPENDENT_AMBULATORY_CARE_PROVIDER_SITE_OTHER): Payer: Medicare Other | Admitting: Dermatology

## 2022-03-16 DIAGNOSIS — L408 Other psoriasis: Secondary | ICD-10-CM

## 2022-03-16 DIAGNOSIS — Z79899 Other long term (current) drug therapy: Secondary | ICD-10-CM | POA: Diagnosis not present

## 2022-03-16 DIAGNOSIS — L409 Psoriasis, unspecified: Secondary | ICD-10-CM | POA: Diagnosis not present

## 2022-03-16 MED ORDER — KETOCONAZOLE 2 % EX SHAM: MEDICATED_SHAMPOO | CUTANEOUS | 6 refills | Status: DC

## 2022-03-16 MED ORDER — HYDROCORTISONE 2.5 % EX CREA: TOPICAL_CREAM | CUTANEOUS | 11 refills | Status: DC

## 2022-03-16 MED ORDER — MOMETASONE FUROATE 0.1 % EX SOLN: Freq: Every day | CUTANEOUS | 6 refills | Status: AC

## 2022-03-16 MED ORDER — WYNZORA 0.005-0.064 % EX CREA: 1.0000 "application " | TOPICAL_CREAM | Freq: Every day | CUTANEOUS | 6 refills | Status: DC

## 2022-03-16 MED ORDER — CLOBETASOL PROPIONATE 0.05 % EX CREA: TOPICAL_CREAM | CUTANEOUS | 0 refills | Status: DC

## 2022-03-16 NOTE — Progress Notes (Signed)
Follow-Up Visit   Subjective  Christian Cherry is a 36 y.o. male who presents for the following: Follow-up (Follow up 6 month. 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 Using hydrocortisone cream, mometasone 0.1 solution and ketoconazole shampoo to scalp. Hx of psoriasis at left lower leg. Reports new area at left lower leg. ). The patient has spots, moles and lesions to be evaluated, some may be new or changing and the patient has concerns that these could be cancer.  The following portions of the chart were reviewed this encounter and updated as appropriate:  Tobacco  Allergies  Meds  Problems  Med Hx  Surg Hx  Fam Hx     Review of Systems: No other skin or systemic complaints except as noted in HPI or Assessment and Plan.  Objective  Well appearing patient in no apparent distress; mood and affect are within normal limits.  A focused examination was performed including left lower leg, face and scalp. Relevant physical exam findings are noted in the Assessment and Plan.  left lower leg Excoriations at left lower leg  Scalp; face A little scale on nasolabia, clear on scalp   Assessment & Plan  Psoriasis left lower leg Psoriasis is a chronic non-curable, but treatable genetic/hereditary disease that may have other systemic features affecting other organ systems such as joints (Psoriatic Arthritis). It is associated with an increased risk of inflammatory bowel disease, heart disease, non-alcoholic fatty liver disease, and depression.   Chronic and persistent condition with duration or expected duration over one year. Condition is symptomatic / bothersome to patient. Not to goal, but pt not interested in more aggressive treatment.  Flared.  Start clobetasol cream 0.05 % - apply topically to aa at left lower leg bid for 2 weeks then d/c.  Topical steroids (such as triamcinolone, fluocinolone, fluocinonide, mometasone, clobetasol, halobetasol, betamethasone, hydrocortisone) can cause  thinning and lightening of the skin if they are used for too long in the same area. Your physician has selected the right strength medicine for your problem and area affected on the body. Please use your medication only as directed by your physician to prevent side effects.   Continue Wynzora once or twice a day up to 5 days a week to left leg as needed only. Call for refills  clobetasol cream (TEMOVATE) 0.05 % - left lower leg Apply topically to affected areas at lower legs bid for 2 weeks then d/c Related Medications Calcipotriene-Betameth Diprop (WYNZORA) 0.005-0.064 % CREA Apply 1 application  topically daily. To left lower leg  Sebopsoriasis Scalp; face Chronic and persistent condition with duration or expected duration over one year. Condition is symptomatic / bothersome to patient. Not Currently at goal.  Continue HC 2.5% cream to nasal/facial areas at bedtime Monday, Wednesday, Friday. Continue Mometasone 0.1% solution at bedtime to scalp  Monday, Wednesday, Friday. Continue as Ketoconazole 2% shampoo 3 times per week as directed   Psoriasis is a chronic non-curable, but treatable genetic/hereditary disease that may have other systemic features affecting other organ systems such as joints (Psoriatic Arthritis). It is associated with an increased risk of inflammatory bowel disease, heart disease, non-alcoholic fatty liver disease, and depression.    mometasone (ELOCON) 0.1 % lotion - Scalp; face Apply topically daily. Monday, Wednesday, Friday  Related Medications ketoconazole (NIZORAL) 2 % shampoo Three times per week. Lather on scalp, leave on 5-8 minutes, rinse well hydrocortisone 2.5 % cream Apply to face Monday, Wednesday, Friday  Return in about 6 months (around  09/15/2022) for psoriasis / sebopsoriasis .  IRuthell Rummage, CMA, am acting as scribe for Sarina Ser, MD. Documentation: I have reviewed the above documentation for accuracy and completeness, and I agree  with the above.  Sarina Ser, MD

## 2022-03-16 NOTE — Patient Instructions (Addendum)
Topical steroids (such as triamcinolone, fluocinolone, fluocinonide, mometasone, clobetasol, halobetasol, betamethasone, hydrocortisone) can cause thinning and lightening of the skin if they are used for too long in the same area. Your physician has selected the right strength medicine for your problem and area affected on the body. Please use your medication only as directed by your physician to prevent side effects.    Call us if rash at lower leg has not gone away.    Due to recent changes in healthcare laws, you may see results of your pathology and/or laboratory studies on MyChart before the doctors have had a chance to review them. We understand that in some cases there may be results that are confusing or concerning to you. Please understand that not all results are received at the same time and often the doctors may need to interpret multiple results in order to provide you with the best plan of care or course of treatment. Therefore, we ask that you please give [place] 2 business days to thoroughly review all your results before contacting the office for clarification. Should we see a critical lab result, you will be contacted sooner.   If You Need Anything After Your Visit  If you have any questions or concerns for your doctor, please call our main line at [phone] and press option 4 to reach your doctor's medical assistant. If no one answers, please leave a voicemail as directed and we will return your call as soon as possible. Messages left after 4 pm will be answered the following business day.   You may also send [place] a message via [place]. We typically respond to MyChart messages within 1-2 business days.  For prescription refills, please ask your pharmacy to contact our office. Our fax number is [phone].  If you have an urgent issue when the clinic is closed that cannot wait until the next business day, you can page your doctor at the number below.    Please note that while we  do our best to be available for urgent issues outside of office hours, we are not available 24/7.   If you have an urgent issue and are unable to reach [place], you may choose to seek medical care at your doctor's office, retail clinic, urgent care center, or emergency room.  If you have a medical emergency, please immediately call 911 or go to the emergency department.  Pager Numbers  - Dr. Nehemiah Massed: [phone]  - Dr. Laurence Ferrari: [phone]  - Dr. Nicole Kindred: [phone]  In the event of inclement weather, please call our main line at [phone] for an update on the status of any delays or closures.  Dermatology Medication Tips: Please keep the boxes that topical medications come in in order to help keep track of the instructions about where and how to use these. Pharmacies typically print the medication instructions only on the boxes and not directly on the medication tubes.   If your medication is too expensive, please contact our office at [phone] option 4 or send [place] a message through [place].   We are unable to tell what your co-pay for medications will be in advance as this is different depending on your insurance coverage. However, we may be able to find a substitute medication at lower cost or fill out paperwork to get insurance to cover a needed medication.   If a prior authorization is required to get your medication covered by your insurance company, please allow [place] 1-2 business days to complete this process.  Drug prices often vary depending on where the prescription is filled and some pharmacies may offer cheaper prices.  The website www.goodrx.com contains coupons for medications through different pharmacies. The prices here do not account for what the cost may be with help from insurance (it may be cheaper with your insurance), but the website can give you the price if you did not use any insurance.  - You can print the associated coupon and take it with your prescription to  the pharmacy.  - You may also stop by our office during regular business hours and pick up a GoodRx coupon card.  - If you need your prescription sent electronically to a different pharmacy, notify our office through [facility] or by phone at [phone] option 4.     Si Usted Necesita Algo Despus de Su Visita  Tambin puede enviarnos un mensaje a travs de Pharmacist, community. Por lo general respondemos a los mensajes de MyChart en el transcurso de 1 a 2 das hbiles.  Para renovar recetas, por favor pida a su farmacia que se ponga en contacto con nuestra oficina. Harland Dingwall de fax es [place] [phone].  Si tiene un asunto urgente cuando la clnica est cerrada y que no puede esperar hasta el siguiente da hbil, puede llamar/localizar a su doctor(a) al nmero que aparece a continuacin.   Por favor, tenga en cuenta que aunque hacemos todo lo posible para estar disponibles para asuntos urgentes fuera del horario de [place], no estamos disponibles las 24 horas del da, los 7 das de la [place].   Si tiene un problema urgente y no puede comunicarse con nosotros, puede optar por buscar atencin mdica  en el consultorio de su doctor(a), en una clnica privada, en un centro de atencin urgente o en una sala de emergencias.  Si tiene Engineering geologist, por favor llame inmediatamente al 911 o vaya a la sala de emergencias.  Nmeros de bper  - Dr. Nehemiah Massed: [phone]  - Dra. Moye: [phone]  - Dra. Nicole Kindred: [phone]  En caso de inclemencias del [place], por favor llame a Johnsie Kindred principal al [phone] para una actualizacin sobre el [place] de cualquier retraso o cierre.  Consejos para la medicacin en dermatologa: Por favor, guarde las cajas en las que vienen los medicamentos de uso tpico para ayudarle a seguir las instrucciones sobre dnde y cmo usarlos. Las farmacias generalmente imprimen las instrucciones del medicamento slo en las cajas y no directamente en  los tubos del [place].   Si su medicamento es muy caro, por favor, pngase en contacto con Zigmund Daniel llamando al [phone] y presione la opcin 4 o envenos un mensaje a travs de Pharmacist, community.   No podemos decirle cul ser su copago por los medicamentos por adelantado ya que esto es diferente dependiendo de la cobertura de su seguro. Sin embargo, es posible que podamos encontrar un medicamento sustituto a Electrical engineer un formulario para que el seguro cubra el medicamento que se considera necesario.   Si se requiere una autorizacin previa para que su compaa de seguros [place] su medicamento, por favor permtanos de 1 a 2 das hbiles para completar este proceso.  Los precios de los medicamentos varan con frecuencia dependiendo del Environmental consultant de dnde se surte la receta y alguna farmacias pueden ofrecer precios ms baratos.  El sitio web www.goodrx.com tiene cupones para medicamentos de Airline pilot. Los precios aqu no tienen en cuenta lo que podra costar con la ayuda del seguro (puede ser ms  barato con su seguro), pero el sitio web puede darle el precio si no utiliz ningn seguro.  - Puede imprimir el cupn correspondiente y llevarlo con su receta a la farmacia.  - Tambin puede pasar por nuestra oficina durante el horario de atencin regular y recoger una tarjeta de cupones de GoodRx.  - Si necesita que su receta se enve electrnicamente a una farmacia diferente, informe a nuestra oficina a travs de MyChart de [place] o por telfono llamando al [phone] y presione la opcin 4.  

## 2022-03-17 ENCOUNTER — Telehealth: Payer: Self-pay | Admitting: *Deleted

## 2022-03-17 NOTE — Telephone Encounter (Signed)
Patient is calling for whether crocks would be a good supportive shoes,explained to him that only shoes that were suggested by physician earlier, verbalized understanding.

## 2022-03-22 ENCOUNTER — Encounter: Payer: Self-pay | Admitting: Dermatology

## 2022-04-27 ENCOUNTER — Encounter: Payer: Self-pay | Admitting: Podiatry

## 2022-04-27 ENCOUNTER — Ambulatory Visit (INDEPENDENT_AMBULATORY_CARE_PROVIDER_SITE_OTHER): Payer: Medicare Other | Admitting: Podiatry

## 2022-04-27 DIAGNOSIS — D2372 Other benign neoplasm of skin of left lower limb, including hip: Secondary | ICD-10-CM | POA: Diagnosis not present

## 2022-04-27 DIAGNOSIS — M79676 Pain in unspecified toe(s): Secondary | ICD-10-CM | POA: Diagnosis not present

## 2022-04-27 DIAGNOSIS — B351 Tinea unguium: Secondary | ICD-10-CM | POA: Diagnosis not present

## 2022-04-27 DIAGNOSIS — E119 Type 2 diabetes mellitus without complications: Secondary | ICD-10-CM | POA: Diagnosis not present

## 2022-04-27 DIAGNOSIS — D2371 Other benign neoplasm of skin of right lower limb, including hip: Secondary | ICD-10-CM | POA: Diagnosis not present

## 2022-04-27 NOTE — Progress Notes (Signed)
He presents today chief complaint of painful elongated toenails and calluses bilateral.  Objective: Vital signs stable alert oriented x 3 pulses are palpable.  Still has loss of sensation per Semmes Weinstein monofilament.  Toenails are long thick yellow dystrophic with mycotic with severe thick calluses bilateral foot.  Assessment: Benign skin lesions diabetes mellitus with diabetic peripheral neuropathy pain limb secondary onychomycosis.  Plan: Debridement of nails and skin bilateral.  Follow-up with me as needed

## 2022-05-06 ENCOUNTER — Ambulatory Visit: Payer: Medicare Other | Admitting: Podiatry

## 2022-07-07 ENCOUNTER — Ambulatory Visit (INDEPENDENT_AMBULATORY_CARE_PROVIDER_SITE_OTHER): Payer: Medicare Other | Admitting: Nurse Practitioner

## 2022-07-07 ENCOUNTER — Encounter: Payer: Self-pay | Admitting: Nurse Practitioner

## 2022-07-07 VITALS — BP 150/78 | HR 78 | Ht 67.0 in | Wt 285.0 lb

## 2022-07-07 DIAGNOSIS — R5383 Other fatigue: Secondary | ICD-10-CM

## 2022-07-07 DIAGNOSIS — E669 Obesity, unspecified: Secondary | ICD-10-CM

## 2022-07-07 DIAGNOSIS — R7303 Prediabetes: Secondary | ICD-10-CM

## 2022-07-07 DIAGNOSIS — L409 Psoriasis, unspecified: Secondary | ICD-10-CM | POA: Diagnosis not present

## 2022-07-07 NOTE — Progress Notes (Signed)
Established Patient Office Visit  Subjective:  Patient ID: Christian Cherry, male    DOB: [birth year]  Age: 37 y.o. MRN: [id number]  Chief Complaint  Patient presents with   Follow-up    Acute visit for foot pain, bilateral, heel pain.  Reports no more foot pain.  Fasting for labs today.  Gave patient letter for [place] [employer] to have his packages delivered to his door due to orthopedic concerns and asthma.     History reviewed. No pertinent past medical history.  Social History   Socioeconomic History   Marital status: Single    Spouse name: Not on file   Number of children: Not on file   Years of education: Not on file   Highest education level: Not on file  Occupational History   Not on file  Tobacco Use   Smoking status: Never   Smokeless tobacco: Never  Substance and Sexual Activity   Alcohol use: Yes    Comment: rarely   Drug use: Not on file   Sexual activity: Not on file  Other Topics Concern   Not on file  Social History Narrative   Not on file   Social Determinants of Health   Financial Resource Strain: Not on file  Food Insecurity: Not on file  Transportation Needs: Not on file  Physical Activity: Not on file  Stress: Not on file  Social Connections: Not on file  Intimate Partner Violence: Not on file    History reviewed. No pertinent family history.  Allergies  Allergen Reactions   Eggs Or Egg-Derived Products Diarrhea   Gabapentin Other (See Comments)    Other reaction(s): Unknown   Penicillins Other (See Comments)    unknown   Shellfish Allergy Hives   Shellfish-Derived Products Hives   Sulfa Antibiotics Rash and Other (See Comments)    Review of Systems  Constitutional: Negative.   HENT: Negative.    Eyes: Negative.   Respiratory: Negative.    Cardiovascular: Negative.   Gastrointestinal: Negative.   Genitourinary: Negative.   Musculoskeletal:  Positive for joint pain.  Skin:  Positive for rash.  Neurological: Negative.    Endo/Heme/Allergies: Negative.   Psychiatric/Behavioral: Negative.         Objective:   BP (!) 150/78   Pulse 78   Ht 5' 7"$  (1.702 m)   Wt 285 lb (129.3 kg)   SpO2 99%   BMI 44.64 kg/m   Vitals:   07/07/22 1111  BP: (!) 150/78  Pulse: 78  Height: 5' 7"$  (1.702 m)  Weight: 285 lb (129.3 kg)  SpO2: 99%  BMI (Calculated): 44.63    Physical Exam Vitals reviewed.  Constitutional:      Appearance: Normal appearance.  HENT:     Head: Normocephalic.     Nose: Nose normal.     Mouth/Throat:     Mouth: Mucous membranes are moist.  Eyes:     Pupils: Pupils are equal, round, and reactive to light.  Cardiovascular:     Rate and Rhythm: Normal rate and regular rhythm.  Pulmonary:     Effort: Pulmonary effort is normal.     Breath sounds: Normal breath sounds.  Abdominal:     General: Bowel sounds are normal.     Palpations: Abdomen is soft.  Musculoskeletal:        General: Tenderness present.     Cervical back: Neck supple.  Skin:    General: Skin is warm and dry.  Neurological:  Mental Status: He is alert and oriented to person, place, and time.  Psychiatric:        Mood and Affect: Mood normal.        Behavior: Behavior normal.      No results found for any visits on 07/07/22.  No results found for this or any previous visit (from the past 2160 hour(s)).    Assessment & Plan:   Problem List Items Addressed This Visit       Musculoskeletal and Integument   Psoriasis     Other   Fatigue   Relevant Orders   TSH   Prediabetes - Primary   Relevant Orders   Hemoglobin A1c   CMP14+EGFR   Obesity with serious comorbidity   Relevant Orders   Lipid panel    @VISPATINSTR$ @  Return in about 4 months (around 11/05/2022) for Followup appt in 4 months, fasting labs prior.   Total time spent: 25 minutes  Evern Bio, NP  07/07/2022

## 2022-07-07 NOTE — Patient Instructions (Signed)
1) Pt has follow up with Podiatry next month 2) Fasting labs prior 3) Followup appt in 4 months, fasting labs prior

## 2022-07-08 ENCOUNTER — Telehealth: Payer: Self-pay

## 2022-07-08 LAB — CMP14+EGFR
ALT: 22 IU/L (ref 0–44)
AST: 29 IU/L (ref 0–40)
Albumin/Globulin Ratio: 1.6 (ref 1.2–2.2)
Albumin: 4.4 g/dL (ref 4.1–5.1)
Alkaline Phosphatase: 114 IU/L (ref 44–121)
BUN/Creatinine Ratio: 19 (ref 9–20)
BUN: 15 mg/dL (ref 6–20)
Bilirubin Total: 0.3 mg/dL (ref 0.0–1.2)
CO2: 24 mmol/L (ref 20–29)
Calcium: 9.1 mg/dL (ref 8.7–10.2)
Chloride: 101 mmol/L (ref 96–106)
Creatinine, Ser: 0.77 mg/dL (ref 0.76–1.27)
Globulin, Total: 2.8 g/dL (ref 1.5–4.5)
Glucose: 78 mg/dL (ref 70–99)
Potassium: 4.1 mmol/L (ref 3.5–5.2)
Sodium: 142 mmol/L (ref 134–144)
Total Protein: 7.2 g/dL (ref 6.0–8.5)
eGFR: 120 mL/min/{1.73_m2} (ref >59)

## 2022-07-08 LAB — TSH: TSH: 3.37 u[IU]/mL (ref 0.450–4.500)

## 2022-07-08 LAB — HEMOGLOBIN A1C
Est. average glucose Bld gHb Est-mCnc: 120 mg/dL
Hgb A1c MFr Bld: 5.8 % — ABNORMAL HIGH (ref 4.8–5.6)

## 2022-07-08 LAB — LIPID PANEL
Chol/HDL Ratio: 3.5 ratio (ref 0.0–5.0)
Cholesterol, Total: 187 mg/dL (ref 100–199)
HDL: 54 mg/dL (ref >39)
LDL Chol Calc (NIH): 115 mg/dL — ABNORMAL HIGH (ref 0–99)
Triglycerides: 99 mg/dL (ref 0–149)
VLDL Cholesterol Cal: 18 mg/dL (ref 5–40)

## 2022-07-08 NOTE — Progress Notes (Signed)
Patient informed. 

## 2022-07-08 NOTE — Telephone Encounter (Signed)
Patient is requesting a cane or something to help with his walking due to how bad his cramps and pain is

## 2022-08-04 ENCOUNTER — Telehealth: Payer: Self-pay

## 2022-08-04 NOTE — Telephone Encounter (Signed)
Patient left nurse voicemail that he has been trying to reach out office about a cream for his leg.  Returned patient's phone call but no answer and left message. aw

## 2022-08-05 ENCOUNTER — Ambulatory Visit: Payer: Medicare Other | Admitting: Podiatry

## 2022-09-14 ENCOUNTER — Ambulatory Visit: Payer: Medicare Other | Admitting: Podiatry

## 2022-09-17 ENCOUNTER — Ambulatory Visit (INDEPENDENT_AMBULATORY_CARE_PROVIDER_SITE_OTHER): Payer: Medicare Other | Admitting: Dermatology

## 2022-09-17 ENCOUNTER — Encounter: Payer: Self-pay | Admitting: Dermatology

## 2022-09-17 DIAGNOSIS — L409 Psoriasis, unspecified: Secondary | ICD-10-CM | POA: Diagnosis not present

## 2022-09-17 DIAGNOSIS — L408 Other psoriasis: Secondary | ICD-10-CM

## 2022-09-17 DIAGNOSIS — L821 Other seborrheic keratosis: Secondary | ICD-10-CM | POA: Diagnosis not present

## 2022-09-17 DIAGNOSIS — Z79899 Other long term (current) drug therapy: Secondary | ICD-10-CM | POA: Diagnosis not present

## 2022-09-17 MED ORDER — KETOCONAZOLE 2 % EX SHAM: MEDICATED_SHAMPOO | CUTANEOUS | 6 refills | Status: DC

## 2022-09-17 MED ORDER — CLOBETASOL PROPIONATE 0.05 % EX CREA: TOPICAL_CREAM | CUTANEOUS | 2 refills | Status: DC

## 2022-09-17 NOTE — Patient Instructions (Addendum)
For left leg -  Restart clobetasol once daily for up to 5 days a week as needed to area at left lower leg. Avoid applying to face, groin, and axilla. Use as directed. Long-term use can cause thinning of the skin.  Topical steroids (such as triamcinolone, fluocinolone, fluocinonide, mometasone, clobetasol, halobetasol, betamethasone, hydrocortisone) can cause thinning and lightening of the skin if they are used for too long in the same area. Your physician has selected the right strength medicine for your problem and area affected on the body. Please use your medication only as directed by your physician to prevent side effects.   For scalp -  Continue as Ketoconazole 2% shampoo apply three times per week, massage into scalp and leave in for 5-10 minutes before rinsing out.  Due to recent changes in healthcare laws, you may see results of your pathology and/or laboratory studies on MyChart before the doctors have had a chance to review them. We understand that in some cases there may be results that are confusing or concerning to you. Please understand that not all results are received at the same time and often the doctors may need to interpret multiple results in order to provide you with the best plan of care or course of treatment. Therefore, we ask that you please give [place] 2 business days to thoroughly review all your results before contacting the office for clarification. Should we see a critical lab result, you will be contacted sooner.   If You Need Anything After Your Visit  If you have any questions or concerns for your doctor, please call our main line at [phone] and press option 4 to reach your doctor's medical assistant. If no one answers, please leave a voicemail as directed and we will return your call as soon as possible. Messages left after 4 pm will be answered the following business day.   You may also send [place] a message via MyChart. We typically respond to MyChart messages within  1-2 business days.  For prescription refills, please ask your pharmacy to contact our office. Our fax number is [phone].  If you have an urgent issue when the clinic is closed that cannot wait until the next business day, you can page your doctor at the number below.    Please note that while we do our best to be available for urgent issues outside of office hours, we are not available 24/7.   If you have an urgent issue and are unable to reach [place], you may choose to seek medical care at your doctor's office, retail clinic, urgent care center, or emergency room.  If you have a medical emergency, please immediately call 911 or go to the emergency department.  Pager Numbers  - Dr. Gwen Pounds: [phone]  - Dr. Neale Burly: [phone]  - Dr. Roseanne Reno: [phone]  In the event of inclement weather, please call our main line at [phone] for an update on the status of any delays or closures.  Dermatology Medication Tips: Please keep the boxes that topical medications come in in order to help keep track of the instructions about where and how to use these. Pharmacies typically print the medication instructions only on the boxes and not directly on the medication tubes.   If your medication is too expensive, please contact our office at [phone] option 4 or send [place] a message through MyChart.   We are unable to tell what your co-pay for medications will be in advance as this is different depending on your  insurance coverage. However, we may be able to find a substitute medication at lower cost or fill out paperwork to get insurance to cover a needed medication.   If a prior authorization is required to get your medication covered by your insurance company, please allow [place] 1-2 business days to complete this process.  Drug prices often vary depending on where the prescription is filled and some pharmacies may offer cheaper prices.  The website www.goodrx.com contains coupons for  medications through different pharmacies. The prices here do not account for what the cost may be with help from insurance (it may be cheaper with your insurance), but the website can give you the price if you did not use any insurance.  - You can print the associated coupon and take it with your prescription to the pharmacy.  - You may also stop by our office during regular business hours and pick up a GoodRx coupon card.  - If you need your prescription sent electronically to a different pharmacy, notify our office through [facility] or by phone at [phone] option 4.

## 2022-09-17 NOTE — Progress Notes (Signed)
   Follow-Up Visit   Subjective  Christian Cherry is a 37 y.o. male who presents for the following: Psoriasis At left lower leg. Patient not currently using anything. He thinks he may have used clobetasol cream but does not remember getting Wynzora.   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 Using ketoconazole shampoo but patient does not recall using mometasone or HC 2.5% cream that was prescribed.   The following portions of the chart were reviewed this encounter and updated as appropriate: medications, allergies, medical history  Review of Systems:  No other skin or systemic complaints except as noted in HPI or Assessment and Plan.  Objective  Well appearing patient in no apparent distress; mood and affect are within normal limits.  Areas Examined: Legs, scalp Relevant exam findings are noted in the Assessment and Plan.     Assessment & Plan   PSORIASIS Well-demarcated erythematous papules/plaques with silvery scale, guttate pink scaly papules.   Chronic and persistent condition with duration or expected duration over one year. Condition is symptomatic/ bothersome to patient. Not currently at goal.  Treatment Plan: Restart clobetasol once daily for up to 5 days a week as needed to area at left lower leg. Avoid applying to face, groin, and axilla. Use as directed. Long-term use can cause thinning of the skin.  Topical steroids (such as triamcinolone, fluocinolone, fluocinonide, mometasone, clobetasol, halobetasol, betamethasone, hydrocortisone) can cause thinning and lightening of the skin if they are used for too long in the same area. Your physician has selected the right strength medicine for your problem and area affected on the body. Please use your medication only as directed by your physician to prevent side effects.    Counseling on psoriasis and coordination of care  psoriasis is a chronic non-curable, but treatable genetic/hereditary disease that may have other systemic  features affecting other organ systems such as joints (Psoriatic Arthritis). It is associated with an increased risk of inflammatory bowel disease, heart disease, non-alcoholic fatty liver disease, and depression.  Treatments include light and laser treatments; topical medications; and systemic medications including oral and injectables.  Sebopsoriasis Scalp, face Chronic and persistent condition with duration or expected duration over one year. Condition is symptomatic / bothersome to patient. Not Currently at goal.   Continue as Ketoconazole 2% shampoo 3 times per week as directed  Seborrheic psoriasis Related Medications hydrocortisone 2.5 % cream Apply to face Monday, Wednesday, Friday ketoconazole (NIZORAL) 2 % shampoo Three times per week. Lather on scalp, leave on 5-8 minutes, rinse well  Psoriasis Related Medications Calcipotriene-Betameth Diprop (WYNZORA) 0.005-0.064 % CREA Apply 1 application  topically daily. To left lower leg clobetasol cream (TEMOVATE) 0.05 % Apply topically to affected areas at lower legs daily for up to 5 days a week as needed. Avoid applying to face, groin, and axilla. Use as directed. Long-term use can cause thinning of the skin.  Return in about 1 year (around 09/17/2023) for Psoriasis.  Anise Salvo, RMA, am acting as scribe for Armida Sans, MD .  Documentation: I have reviewed the above documentation for accuracy and completeness, and I agree with the above.  Armida Sans, MD

## 2022-09-22 ENCOUNTER — Telehealth: Payer: Self-pay

## 2022-09-22 NOTE — Telephone Encounter (Signed)
Patient LM asking for call back 

## 2022-09-24 ENCOUNTER — Encounter: Payer: Self-pay | Admitting: Dermatology

## 2022-09-30 ENCOUNTER — Ambulatory Visit (INDEPENDENT_AMBULATORY_CARE_PROVIDER_SITE_OTHER): Payer: Medicare Other | Admitting: Podiatry

## 2022-09-30 DIAGNOSIS — E119 Type 2 diabetes mellitus without complications: Secondary | ICD-10-CM

## 2022-09-30 DIAGNOSIS — M79676 Pain in unspecified toe(s): Secondary | ICD-10-CM

## 2022-09-30 DIAGNOSIS — B351 Tinea unguium: Secondary | ICD-10-CM

## 2022-09-30 DIAGNOSIS — D2371 Other benign neoplasm of skin of right lower limb, including hip: Secondary | ICD-10-CM | POA: Diagnosis not present

## 2022-09-30 NOTE — Progress Notes (Signed)
Presents today chief complaint of painful elongated toenails and calluses bilateral.  Objective: Toenails are long thick yellow dystrophic onychomycotic with thick dystrophic skin and benign skin lesions.  No open lesions are noted.  Assessment: Pain limb secondary to onychomycosis benign skin lesions.  Plan: Debridement of all benign skin lesions and toenails.

## 2022-11-05 ENCOUNTER — Ambulatory Visit: Payer: Medicare Other | Admitting: Nurse Practitioner

## 2022-12-07 ENCOUNTER — Telehealth: Payer: Self-pay

## 2022-12-07 NOTE — Telephone Encounter (Signed)
Patient LM asking for call back 

## 2022-12-08 NOTE — Telephone Encounter (Signed)
Was able to resolve problem on his own.

## 2022-12-30 ENCOUNTER — Ambulatory Visit (INDEPENDENT_AMBULATORY_CARE_PROVIDER_SITE_OTHER): Payer: Medicare Other | Admitting: Podiatry

## 2022-12-30 DIAGNOSIS — D2372 Other benign neoplasm of skin of left lower limb, including hip: Secondary | ICD-10-CM

## 2022-12-30 DIAGNOSIS — E119 Type 2 diabetes mellitus without complications: Secondary | ICD-10-CM

## 2022-12-30 DIAGNOSIS — B351 Tinea unguium: Secondary | ICD-10-CM

## 2022-12-30 DIAGNOSIS — M79676 Pain in unspecified toe(s): Secondary | ICD-10-CM

## 2022-12-30 DIAGNOSIS — D2371 Other benign neoplasm of skin of right lower limb, including hip: Secondary | ICD-10-CM | POA: Diagnosis not present

## 2022-12-30 NOTE — Progress Notes (Signed)
Presents today chief complaint of painful calluses and elongated nails.  Objective: Vital signs are stable alert oriented x 3.  Pulses are palpable.  There is no erythema edema cellulitis drainage or odor.  Toenails are long thick yellow dystrophic onychomycotic multiple benign skin lesions plantar aspect of the forefoot and distal clavi.  Assessment pain limb secondary to onychomycosis hammertoe deformities and distal clavi with benign skin lesions plantar foot.  Plan: Debrided benign lesions debrided toenails 1 through 5 bilateral follow-up as needed or in 3 months

## 2023-02-25 ENCOUNTER — Ambulatory Visit (INDEPENDENT_AMBULATORY_CARE_PROVIDER_SITE_OTHER): Payer: Medicare Other | Admitting: Cardiology

## 2023-02-25 ENCOUNTER — Encounter: Payer: Self-pay | Admitting: Cardiology

## 2023-02-25 VITALS — BP 138/88 | HR 78 | Ht 71.0 in | Wt 276.0 lb

## 2023-02-25 DIAGNOSIS — M25512 Pain in left shoulder: Secondary | ICD-10-CM | POA: Diagnosis not present

## 2023-02-25 NOTE — Progress Notes (Signed)
Established Patient Office Visit  Subjective:  Patient ID: Christian Cherry, male    DOB: Sep 19, 1985  Age: 37 y.o. MRN: [id number]  Chief Complaint  Patient presents with   Acute Visit    Left shoulder pain and arthritis    Patient in office complaining of left shoulder pain. Patient states pain started "a while ago". Patient reports sleeping on his left side. Patient has used Voltaren gel, ibuprofen and tylenol with some relief. Will order an xray to confirm there is no breakage. Will send referral to orthopaedics pending results of xray.   Shoulder Pain  The pain is present in the left shoulder. This is a new problem. The current episode started more than 1 month ago. There has been no history of extremity trauma. The problem occurs intermittently. The problem has been gradually worsening. The pain is mild. Pertinent negatives include no limited range of motion. The symptoms are aggravated by activity. He has tried NSAIDS, acetaminophen, rest, cold and heat for the symptoms. The treatment provided mild relief.    No other concerns at this time.   No past medical history on file.  Past Surgical History:  Procedure Laterality Date   SPINE SURGERY      Social History   Socioeconomic History   Marital status: Single    Spouse name: Not on file   Number of children: Not on file   Years of education: Not on file   Highest education level: Not on file  Occupational History   Not on file  Tobacco Use   Smoking status: Never   Smokeless tobacco: Never  Substance and Sexual Activity   Alcohol use: Yes    Comment: rarely   Drug use: Not on file   Sexual activity: Not on file  Other Topics Concern   Not on file  Social History Narrative   Not on file   Social Determinants of Health   Financial Resource Strain: Not on file  Food Insecurity: Not on file  Transportation Needs: Not on file  Physical Activity: Not on file  Stress: Not on file  Social Connections: Not on file   Intimate Partner Violence: Not on file    No family history on file.  Allergies  Allergen Reactions   Egg-Derived Products Diarrhea   Gabapentin Other (See Comments)    Other reaction(s): Unknown   Penicillins Other (See Comments)    unknown   Shellfish Allergy Hives   Shellfish-Derived Products Hives   Sulfa Antibiotics Rash and Other (See Comments)    Review of Systems  Constitutional: Negative.   HENT: Negative.    Eyes: Negative.   Respiratory: Negative.  Negative for shortness of breath.   Cardiovascular: Negative.  Negative for chest pain.  Gastrointestinal: Negative.  Negative for abdominal pain, constipation and diarrhea.  Genitourinary: Negative.   Musculoskeletal:  Negative for joint pain and myalgias.       Left shoulder pain  Skin: Negative.   Neurological: Negative.  Negative for dizziness and headaches.  Endo/Heme/Allergies: Negative.   All other systems reviewed and are negative.      Objective:   BP 138/88   Pulse 78   Ht 5\' 11"  (1.803 m)   Wt 276 lb (125.2 kg)   SpO2 98%   BMI 38.49 kg/m   Vitals:   02/25/23 1139  BP: 138/88  Pulse: 78  Height: 5\' 11"  (1.803 m)  Weight: 276 lb (125.2 kg)  SpO2: 98%  BMI (Calculated): 38.51  Physical Exam Nursing note reviewed.  Constitutional:      Appearance: Normal appearance. He is normal weight.  HENT:     Head: Normocephalic and atraumatic.     Nose: Nose normal.     Mouth/Throat:     Mouth: Mucous membranes are moist.     Pharynx: Oropharynx is clear.  Eyes:     Extraocular Movements: Extraocular movements intact.     Conjunctiva/sclera: Conjunctivae normal.     Pupils: Pupils are equal, round, and reactive to light.  Cardiovascular:     Rate and Rhythm: Normal rate and regular rhythm.     Pulses: Normal pulses.     Heart sounds: Normal heart sounds.  Pulmonary:     Effort: Pulmonary effort is normal.     Breath sounds: Normal breath sounds.  Abdominal:     General: Abdomen is  flat. Bowel sounds are normal.     Palpations: Abdomen is soft.  Musculoskeletal:        General: Normal range of motion.     Cervical back: Normal range of motion.  Skin:    General: Skin is warm and dry.  Neurological:     General: No focal deficit present.     Mental Status: He is alert and oriented to person, place, and time.  Psychiatric:        Mood and Affect: Mood normal.        Behavior: Behavior normal.        Thought Content: Thought content normal.        Judgment: Judgment normal.      No results found for any visits on 02/25/23.  No results found for this or any previous visit (from the past 2160 hour(s)).    Assessment & Plan:  Left shoulder xray today.  Problem List Items Addressed This Visit       Other   Acute pain of left shoulder - Primary   Relevant Orders   DG Shoulder Left    Return if symptoms worsen or fail to improve.   Total time spent: 25 minutes  [employer], NP  02/25/2023   This document may have been prepared by Dragon Voice Recognition software and as such may include unintentional dictation errors.

## 2023-03-31 ENCOUNTER — Ambulatory Visit: Payer: Medicare Other | Admitting: Podiatry

## 2023-05-05 ENCOUNTER — Ambulatory Visit (INDEPENDENT_AMBULATORY_CARE_PROVIDER_SITE_OTHER): Payer: Medicare Other | Admitting: Podiatry

## 2023-05-05 ENCOUNTER — Encounter: Payer: Self-pay | Admitting: Podiatry

## 2023-05-05 DIAGNOSIS — E119 Type 2 diabetes mellitus without complications: Secondary | ICD-10-CM | POA: Diagnosis not present

## 2023-05-05 DIAGNOSIS — D2371 Other benign neoplasm of skin of right lower limb, including hip: Secondary | ICD-10-CM | POA: Diagnosis not present

## 2023-05-05 DIAGNOSIS — B351 Tinea unguium: Secondary | ICD-10-CM

## 2023-05-05 DIAGNOSIS — D2372 Other benign neoplasm of skin of left lower limb, including hip: Secondary | ICD-10-CM

## 2023-05-05 DIAGNOSIS — M79676 Pain in unspecified toe(s): Secondary | ICD-10-CM

## 2023-05-05 NOTE — Progress Notes (Signed)
He presents today chief complaint of painful elongated toenails and calluses.  Objective: Vital signs stable he is alert oriented x 3 pulses are strong.  Toenails are long thick yellow dystrophic clinical mycotic severe foot deformities bilateral.  Significant hyperkeratotic tissue bilateral benign skin lesions.  Assessment: Pain in limb secondary onychomycosis benign skin lesion.  Plan: Debridement of all benign skin lesions debridement of toenails 1 through 5 bilateral.

## 2023-08-04 ENCOUNTER — Ambulatory Visit (INDEPENDENT_AMBULATORY_CARE_PROVIDER_SITE_OTHER): Payer: Medicare HMO | Admitting: Podiatry

## 2023-08-04 DIAGNOSIS — D2372 Other benign neoplasm of skin of left lower limb, including hip: Secondary | ICD-10-CM

## 2023-08-04 DIAGNOSIS — E119 Type 2 diabetes mellitus without complications: Secondary | ICD-10-CM

## 2023-08-04 DIAGNOSIS — M79676 Pain in unspecified toe(s): Secondary | ICD-10-CM

## 2023-08-04 DIAGNOSIS — B351 Tinea unguium: Secondary | ICD-10-CM

## 2023-08-04 DIAGNOSIS — D2371 Other benign neoplasm of skin of right lower limb, including hip: Secondary | ICD-10-CM | POA: Diagnosis not present

## 2023-08-04 NOTE — Progress Notes (Signed)
 He presents today chief complaint of painful elongated toenails with benign skin lesions bilaterally.  Objective: Vital signs are stable alert oriented x 3.  There is no erythema edema cellulitis drainage or odor toenails are long thick yellow dystrophic onychomycotic benign thick skin bilaterally.  Assessment: Pain in limb secondary to onychomycosis benign skin lesions.  Plan: Debridement of benign skin lesions debridement of toenails 1 through 5 bilateral.  Follow-up with him in 3 months

## 2023-09-21 ENCOUNTER — Ambulatory Visit (INDEPENDENT_AMBULATORY_CARE_PROVIDER_SITE_OTHER): Payer: Medicare Other | Admitting: Dermatology

## 2023-09-21 ENCOUNTER — Encounter: Payer: Self-pay | Admitting: Dermatology

## 2023-09-21 DIAGNOSIS — Z7189 Other specified counseling: Secondary | ICD-10-CM | POA: Diagnosis not present

## 2023-09-21 DIAGNOSIS — T07XXXA Unspecified multiple injuries, initial encounter: Secondary | ICD-10-CM

## 2023-09-21 DIAGNOSIS — S80812A Abrasion, left lower leg, initial encounter: Secondary | ICD-10-CM

## 2023-09-21 DIAGNOSIS — L409 Psoriasis, unspecified: Secondary | ICD-10-CM | POA: Diagnosis not present

## 2023-09-21 DIAGNOSIS — L408 Other psoriasis: Secondary | ICD-10-CM

## 2023-09-21 DIAGNOSIS — Z79899 Other long term (current) drug therapy: Secondary | ICD-10-CM

## 2023-09-21 MED ORDER — MUPIROCIN 2 % EX OINT: TOPICAL_OINTMENT | CUTANEOUS | 1 refills | Status: AC

## 2023-09-21 MED ORDER — KETOCONAZOLE 2 % EX SHAM: MEDICATED_SHAMPOO | CUTANEOUS | 11 refills | Status: DC

## 2023-09-21 MED ORDER — CLOBETASOL PROPIONATE 0.05 % EX CREA: TOPICAL_CREAM | CUTANEOUS | 2 refills | Status: DC

## 2023-09-21 MED ORDER — ZORYVE 0.3 % EX CREA: TOPICAL_CREAM | CUTANEOUS | 6 refills | Status: DC

## 2023-09-21 NOTE — Progress Notes (Signed)
   Follow-Up Visit   Subjective  Christian Cherry is a 38 y.o. male who presents for the following: Psoriasis Patient flared at lower left leg, Patient is using clobetasol  cream to affected area.  Patient also to follow up on sebopsoriais at scalp and face, currently using ketoconazole  cream to affected areas.  The following portions of the chart were reviewed this encounter and updated as appropriate: medications, allergies, medical history  Review of Systems:  No other skin or systemic complaints except as noted in HPI or Assessment and Plan.  Objective  Well appearing patient in no apparent distress; mood and affect are within normal limits.  Areas Examined: Scalp, face, legs  Relevant exam findings are noted in the Assessment and Plan.         Assessment & Plan   SEBORRHEIC PSORIASIS   Related Medications ketoconazole  (NIZORAL ) 2 % shampoo Three times per week. Lather on scalp, leave on 5-8 minutes, rinse well PSORIASIS   Related Medications clobetasol  cream (TEMOVATE ) 0.05 % Apply topically to affected areas at lower legs daily on weekends only as needed when flared for psoriasis. Avoid applying to face, groin, and axilla. Use as directed. Long-term use can cause thinning of the skin. mupirocin  ointment (BACTROBAN ) 2 % Apply topically to redness and scab at left lower leg three times daily and cover for 2 weeks until healed Roflumilast (ZORYVE) 0.3 % CREA Apply topically to any red areas at legs daily 7 days a week as needed for psoriasis  PSORIASIS With excoriations and crusts of leg.  No evidence of infection today. Excoriation and crust of legs see  Photos today    Chronic and persistent condition with duration or expected duration over one year. Condition is bothersome/symptomatic for patient. Currently flared.  Patient denies joint pain  Treatment Plan: Left lower leg cleaned with Puracyn, applied mupirocin  ointment to scabs and crust, non stick gauze  applied and wrapped with coban.  Start mupirocin  2 % ointment - apply topically to redness and scabs 3 times daily and cover for 2 weeks.   If healed after 2 weeks can switch to   Start Zoryve 0.3 % cream - apply to any redness at legs daily 7 days a week as needed   Restart Clobetasol  0.05 % cream - apply topically daily to any flared areas 3 days a week on weekends only.   Topical steroids (such as triamcinolone, fluocinolone, fluocinonide, mometasone , clobetasol , halobetasol, betamethasone, hydrocortisone ) can cause thinning and lightening of the skin if they are used for too long in the same area. Your physician has selected the right strength medicine for your problem and area affected on the body. Please use your medication only as directed by your physician to prevent side effects.    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 Condition is symptomatic / bothersome to patient. Not Currently at goal. Treatment Plan  Continue as Ketoconazole  2% shampoo 3 times per week as directed   Return in about 9 months (around 06/22/2024) for psoriasis / sebopsoriasis follow up.  IRandee Busing, CMA, am acting as scribe for Celine Collard, MD.   Documentation: I have reviewed the above documentation for accuracy and completeness, and I agree with the above.  Celine Collard, MD

## 2023-09-21 NOTE — Patient Instructions (Addendum)
 For Psoriasis at lower legs   Start mupirocin  2 % ointment apply to any redness and scabs at left lower leg 3 times daily and cover for 2 weeks. If healed can start using Zoryve 0.3 % cream to red areas at lower leg daily 7 days a week as needed. Can also continue to use clobetasol  cream to flared areas once daily on weekends only as needed.   Avoid applying to face, groin, and axilla. Use as directed. Long-term use can cause thinning of the skin.  Topical steroids (such as triamcinolone, fluocinolone, fluocinonide, mometasone , clobetasol , halobetasol, betamethasone, hydrocortisone ) can cause thinning and lightening of the skin if they are used for too long in the same area. Your physician has selected the right strength medicine for your problem and area affected on the body. Please use your medication only as directed by your physician to prevent side effects.   Your prescription was sent to [facility] in [place]. A representative from [facility] will contact you within 3 business hours to verify your address and insurance information to schedule a free delivery. If for any reason you do not receive a phone call from them, please reach out to them. Their phone number is [phone] and their hours are Monday-Friday 9:00 am-5:00 pm.     Sebopsoriasis   Continue ketoconazole  2 % shampoo -  apply three times per week, massage into scalp and face leave in for 5 minutes before rinsing out          Gentle Skin Care Guide  1. Bathe no more than once a day.  2. Avoid bathing in hot water  3. Use a mild soap like Dove, Vanicream, Cetaphil, CeraVe. Can use Lever 2000 or Cetaphil antibacterial soap  4. Use soap only where you need it. On most days, use it under your arms, between your legs, and on your feet. Let the water rinse other areas unless visibly dirty.  5. When you get out of the bath/shower, use a towel to gently blot your skin dry, don't rub it.  6. While your  skin is still a little damp, apply a moisturizing cream such as Vanicream, CeraVe, Cetaphil, Eucerin, Sarna lotion or plain Vaseline Jelly. For hands apply Neutrogena [place] Hand Cream or Excipial Hand Cream.  7. Reapply moisturizer any time you start to itch or feel dry.  8. Sometimes using free and clear laundry detergents can be helpful. Fabric softener sheets should be avoided. Downy Free & Gentle liquid, or any liquid fabric softener that is free of dyes and perfumes, it acceptable to use  9. If your doctor has given you prescription creams you may apply moisturizers over them      Due to recent changes in healthcare laws, you may see results of your pathology and/or laboratory studies on MyChart before the doctors have had a chance to review them. We understand that in some cases there may be results that are confusing or concerning to you. Please understand that not all results are received at the same time and often the doctors may need to interpret multiple results in order to provide you with the best plan of care or course of treatment. Therefore, we ask that you please give us  2 business days to thoroughly review all your results before contacting the office for clarification. Should we see a critical lab result, you will be contacted sooner.   If You Need Anything After Your Visit  If you have any questions or concerns for your  doctor, please call our main line at [phone] and press option 4 to reach your doctor's medical assistant. If no one answers, please leave a voicemail as directed and we will return your call as soon as possible. Messages left after 4 pm will be answered the following business day.   You may also send us  a message via MyChart. We typically respond to MyChart messages within 1-2 business days.  For prescription refills, please ask your pharmacy to contact our office. Our fax number is [phone].  If you have an urgent issue when the clinic is closed  that cannot wait until the next business day, you can page your doctor at the number below.    Please note that while we do our best to be available for urgent issues outside of office hours, we are not available 24/7.   If you have an urgent issue and are unable to reach us , you may choose to seek medical care at your doctor's office, retail clinic, urgent care center, or emergency room.  If you have a medical emergency, please immediately call 911 or go to the emergency department.  Pager Numbers  - Dr. Bary Likes: [phone]  - Dr. Annette Barters: [phone]  - Dr. Felipe Horton: [phone]   In the event of inclement weather, please call our main line at [phone] for an update on the status of any delays or closures.  Dermatology Medication Tips: Please keep the boxes that topical medications come in in order to help keep track of the instructions about where and how to use these. Pharmacies typically print the medication instructions only on the boxes and not directly on the medication tubes.   If your medication is too expensive, please contact our office at [phone] option 4 or send us  a message through MyChart.   We are unable to tell what your co-pay for medications will be in advance as this is different depending on your insurance coverage. However, we may be able to find a substitute medication at lower cost or fill out paperwork to get insurance to cover a needed medication.   If a prior authorization is required to get your medication covered by your insurance company, please allow us  1-2 business days to complete this process.  Drug prices often vary depending on where the prescription is filled and some pharmacies may offer cheaper prices.  The website www.goodrx.com contains coupons for medications through different pharmacies. The prices here do not account for what the cost may be with help from insurance (it may be cheaper with your insurance), but the website can give  you the price if you did not use any insurance.  - You can print the associated coupon and take it with your prescription to the pharmacy.  - You may also stop by our office during regular business hours and pick up a GoodRx coupon card.  - If you need your prescription sent electronically to a different pharmacy, notify our office through [facility] or by phone at [phone] option 4.     Si Usted Necesita Algo Despus de Su Visita  Tambin puede enviarnos un mensaje a travs de Clinical cytogeneticist. Por lo general respondemos a los mensajes de MyChart en el transcurso de 1 a 2 das hbiles.  Para renovar recetas, por favor pida a su farmacia que se ponga en contacto con nuestra oficina. Franz Jacks de fax es [place] [phone].  Si tiene un asunto urgente cuando la clnica est cerrada y que no puede esperar Freight forwarder  siguiente da hbil, puede llamar/localizar a su doctor(a) al nmero que aparece a continuacin.   Por favor, tenga en cuenta que aunque hacemos todo lo posible para estar disponibles para asuntos urgentes fuera del horario de [place], no estamos disponibles las 24 horas del da, los 7 [address]  [address] de la [place].   Si tiene un problema urgente y no puede comunicarse con nosotros, puede optar por buscar atencin mdica  en el consultorio de su doctor(a), en una clnica privada, en un centro de atencin urgente o en una sala de emergencias.  Si tiene Engineer, drilling, por favor llame inmediatamente al 911 o vaya a la sala de emergencias.  Nmeros de bper  - Dr. Bary Likes: [phone]  - Dra. Annette Barters: [phone]  - Dr. Felipe Horton: [phone]   En caso de inclemencias del tiempo, por favor llame a Lajuan Pila principal al [phone] para una actualizacin sobre el [place] de cualquier retraso o cierre.  Consejos para la medicacin en dermatologa: Por favor, guarde las cajas en las que vienen los medicamentos de uso tpico para ayudarle a seguir las instrucciones sobre dnde  y cmo usarlos. Las farmacias generalmente imprimen las instrucciones del medicamento slo en las cajas y no directamente en los tubos del [place].   Si su medicamento es muy caro, por favor, pngase en contacto con Bettyjane Brunet llamando al [phone] y presione la opcin 4 o envenos un mensaje a travs de Clinical cytogeneticist.   No podemos decirle cul ser su copago por los medicamentos por adelantado ya que esto es diferente dependiendo de la cobertura de su seguro. Sin embargo, es posible que podamos encontrar un medicamento sustituto a Audiological scientist un formulario para que el seguro cubra el medicamento que se considera necesario.   Si se requiere una autorizacin previa para que su compaa de seguros [place] su medicamento, por favor permtanos de 1 a 2 das hbiles para completar este proceso.  Los precios de los medicamentos varan con frecuencia dependiendo del Environmental consultant de dnde se surte la receta y alguna farmacias pueden ofrecer precios ms baratos.  El sitio web www.goodrx.com tiene cupones para medicamentos de Health and safety inspector. Los precios aqu no tienen en cuenta lo que podra costar con la ayuda del seguro (puede ser ms barato con su seguro), pero el sitio web puede darle el precio si no utiliz Tourist information centre manager.  - Puede imprimir el cupn correspondiente y llevarlo con su receta a la farmacia.  - Tambin puede pasar por nuestra oficina durante el horario de atencin regular y Education officer, museum una tarjeta de cupones de GoodRx.  - Si necesita que su receta se enve electrnicamente a una farmacia diferente, informe a nuestra oficina a travs de MyChart de [place] o por telfono llamando al [phone] y presione la opcin 4.

## 2023-11-08 ENCOUNTER — Ambulatory Visit (INDEPENDENT_AMBULATORY_CARE_PROVIDER_SITE_OTHER): Admitting: Podiatry

## 2023-11-08 ENCOUNTER — Encounter: Payer: Self-pay | Admitting: Podiatry

## 2023-11-08 DIAGNOSIS — M79676 Pain in unspecified toe(s): Secondary | ICD-10-CM

## 2023-11-08 DIAGNOSIS — E119 Type 2 diabetes mellitus without complications: Secondary | ICD-10-CM

## 2023-11-08 DIAGNOSIS — B351 Tinea unguium: Secondary | ICD-10-CM

## 2023-11-08 DIAGNOSIS — D2371 Other benign neoplasm of skin of right lower limb, including hip: Secondary | ICD-10-CM | POA: Diagnosis not present

## 2023-11-08 DIAGNOSIS — D2372 Other benign neoplasm of skin of left lower limb, including hip: Secondary | ICD-10-CM

## 2023-11-08 NOTE — Progress Notes (Signed)
 He presents today chief complaint of painful elongated toenails with benign skin lesions bilaterally.  Objective: Vital signs are stable alert oriented x 3.  There is no erythema edema cellulitis drainage or odor toenails are long thick yellow dystrophic onychomycotic benign thick skin bilaterally.  Assessment: Pain in limb secondary to onychomycosis benign skin lesions.  Plan: Debridement of benign skin lesions debridement of toenails 1 through 5 bilateral.  Follow-up with him in 3 months

## 2024-02-14 ENCOUNTER — Ambulatory Visit (INDEPENDENT_AMBULATORY_CARE_PROVIDER_SITE_OTHER): Admitting: Podiatry

## 2024-02-14 ENCOUNTER — Encounter: Payer: Self-pay | Admitting: Podiatry

## 2024-02-14 DIAGNOSIS — D2371 Other benign neoplasm of skin of right lower limb, including hip: Secondary | ICD-10-CM | POA: Diagnosis not present

## 2024-02-14 DIAGNOSIS — B351 Tinea unguium: Secondary | ICD-10-CM | POA: Diagnosis not present

## 2024-02-14 DIAGNOSIS — M79676 Pain in unspecified toe(s): Secondary | ICD-10-CM | POA: Diagnosis not present

## 2024-02-14 DIAGNOSIS — E119 Type 2 diabetes mellitus without complications: Secondary | ICD-10-CM | POA: Diagnosis not present

## 2024-02-14 DIAGNOSIS — D2372 Other benign neoplasm of skin of left lower limb, including hip: Secondary | ICD-10-CM

## 2024-02-14 NOTE — Progress Notes (Signed)
 He presents today chief complaint of painful elongated toenails with benign skin lesions bilaterally.  Objective: Vital signs are stable alert oriented x 3.  There is no erythema edema cellulitis drainage or odor toenails are long thick yellow dystrophic onychomycotic benign thick skin bilaterally.  Assessment: Pain in limb secondary to onychomycosis benign skin lesions.  Plan: Debridement of benign skin lesions debridement of toenails 1 through 5 bilateral.  Follow-up with him in 3 months

## 2024-05-04 ENCOUNTER — Encounter: Payer: Self-pay | Admitting: Dermatology

## 2024-05-04 ENCOUNTER — Ambulatory Visit (INDEPENDENT_AMBULATORY_CARE_PROVIDER_SITE_OTHER): Admitting: Dermatology

## 2024-05-04 DIAGNOSIS — L408 Other psoriasis: Secondary | ICD-10-CM | POA: Diagnosis not present

## 2024-05-04 DIAGNOSIS — L409 Psoriasis, unspecified: Secondary | ICD-10-CM | POA: Diagnosis not present

## 2024-05-04 DIAGNOSIS — Z79899 Other long term (current) drug therapy: Secondary | ICD-10-CM | POA: Diagnosis not present

## 2024-05-04 DIAGNOSIS — Z7189 Other specified counseling: Secondary | ICD-10-CM

## 2024-05-04 MED ORDER — CLOBETASOL PROPIONATE 0.05 % EX CREA: TOPICAL_CREAM | CUTANEOUS | 5 refills | Status: AC

## 2024-05-04 MED ORDER — ZORYVE 0.3 % EX CREA: TOPICAL_CREAM | CUTANEOUS | 6 refills | Status: AC

## 2024-05-04 MED ORDER — KETOCONAZOLE 2 % EX SHAM: MEDICATED_SHAMPOO | CUTANEOUS | 11 refills | Status: AC

## 2024-05-04 NOTE — Progress Notes (Signed)
 Follow-Up Visit   Subjective  Christian Cherry is a 38 y.o. male who presents for the following: Follow up of psoriasis. Patient reports flare on left lower extremity patient is currently using Zoryve  but not his Clobetasol  cream  The following portions of the chart were reviewed this encounter and updated as appropriate: medications, allergies, medical history  Review of Systems:  No other skin or systemic complaints except as noted in HPI or Assessment and Plan.  Objective  Well appearing patient in no apparent distress; mood and affect are within normal limits.  A focused examination was performed of the following areas: Lower extremities   Relevant exam findings are noted in the Assessment and Plan.    Assessment & Plan   PSORIASIS Exam: Well-demarcated erythematous papules/plaques with silvery scale, guttate pink scaly papules on bilateral upper and lower extremities. 15% BSA.  Chronic and persistent condition with duration or expected duration over one year. Condition is bothersome/symptomatic for patient. Currently flared. patient endorses joint pain in bilateral hips and knees.   Psoriasis is a chronic non-curable, but treatable genetic/hereditary disease that may have other systemic features affecting other organ systems such as joints (Psoriatic Arthritis). It is associated with an increased risk of inflammatory bowel disease, heart disease, non-alcoholic fatty liver disease, and depression.  Treatments include light and laser treatments; topical medications; and systemic medications including oral and injectables.  Treatment Plan: - Continue Clobetasol  Apply topically to affected areas at arms and lower legs daily on weekends (Fri, Sat, Sun) only as needed when flared for psoriasis. Avoid applying to face, groin, and axilla. Use as directed. Long-term use can cause thinning of the skin.  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 Condition is symptomatic / bothersome to patient. Not Currently at goal. Treatment Plan  - Continue Ketoconazole  2% shampoo Three times per week. Lather on scalp and face, leave on 5-8 minutes, rinse well   Long term medication management.  Patient is using long term (months to years) prescription medication  to control their dermatologic condition.  These medications require periodic monitoring to evaluate for efficacy and side effects and may require periodic laboratory monitoring. PSORIASIS   This Visit - Roflumilast  (ZORYVE ) 0.3 % CREA - Apply topically to any red areas at legs arms scalp and face daily 7 days a week as needed for psoriasis - clobetasol  cream (TEMOVATE ) 0.05 % - Apply topically to affected areas at arms and lower legs daily on weekends (Fri, Sat, Sun) only as needed when flared for psoriasis. Avoid applying to face, groin, and axilla. Use as directed. Long-term use can cause thinning of the skin. Existing Treatments - mupirocin  ointment (BACTROBAN ) 2 % - Apply topically to redness and scab at left lower leg three times daily and cover for 2 weeks until healed SEBORRHEIC PSORIASIS   This Visit - ketoconazole  (NIZORAL ) 2 % shampoo - Three times per week. Lather on scalp and face, leave on 5-8 minutes, rinse well COUNSELING AND COORDINATION OF CARE   MEDICATION MANAGEMENT    Return in about 8 months (around 01/02/2025) for Psoriasis.  I, Emerick Ege, CMA am acting as scribe for Alm Rhyme, MD.   Documentation: I have reviewed the  above documentation for accuracy and completeness, and I agree with the above.  Alm Rhyme, MD

## 2024-05-04 NOTE — Patient Instructions (Addendum)
 - Continue Clobetasol  Apply topically to affected areas at arms and lower legs daily on weekends (Fri, Sat, Sun) only as needed when flared for psoriasis. Avoid applying to face, groin, and axilla. Use as directed. Long-term use can cause thinning of the skin.  - Continue Zoryve  0.3% Apply topically to any red areas at legs scalp and face daily 7 days a week as needed for psoriasis  - Continue Ketoconazole  2% shampoo Three times per week. Lather on scalp and face, leave on 5-8 minutes, rinse well   Due to recent changes in healthcare laws, you may see results of your pathology and/or laboratory studies on MyChart before the doctors have had a chance to review them. We understand that in some cases there may be results that are confusing or concerning to you. Please understand that not all results are received at the same time and often the doctors may need to interpret multiple results in order to provide you with the best plan of care or course of treatment. Therefore, we ask that you please give us  2 business days to thoroughly review all your results before contacting the office for clarification. Should we see a critical lab result, you will be contacted sooner.   If You Need Anything After Your Visit  If you have any questions or concerns for your doctor, please call our main line at [phone] and press option 4 to reach your doctor's medical assistant. If no one answers, please leave a voicemail as directed and we will return your call as soon as possible. Messages left after 4 pm will be answered the following business day.   You may also send us  a message via MyChart. We typically respond to MyChart messages within 1-2 business days.  For prescription refills, please ask your pharmacy to contact our office. Our fax number is [phone].  If you have an urgent issue when the clinic is closed that cannot wait until the next business day, you can page your doctor at the number below.     Please note that while we do our best to be available for urgent issues outside of office hours, we are not available 24/7.   If you have an urgent issue and are unable to reach us , you may choose to seek medical care at your doctor's office, retail clinic, urgent care center, or emergency room.  If you have a medical emergency, please immediately call 911 or go to the emergency department.  Pager Numbers  - Dr. Hester: [phone]  - Dr. Jackquline: [phone]  - Dr. Claudene: [phone]   - Dr. Raymund: [phone]  In the event of inclement weather, please call our main line at [phone] for an update on the status of any delays or closures.  Dermatology Medication Tips: Please keep the boxes that topical medications come in in order to help keep track of the instructions about where and how to use these. Pharmacies typically print the medication instructions only on the boxes and not directly on the medication tubes.   If your medication is too expensive, please contact our office at [phone] option 4 or send us  a message through MyChart.   We are unable to tell what your co-pay for medications will be in advance as this is different depending on your insurance coverage. However, we may be able to find a substitute medication at lower cost or fill out paperwork to get insurance to cover a needed medication.   If a prior authorization is required to  get your medication covered by your insurance company, please allow us  1-2 business days to complete this process.  Drug prices often vary depending on where the prescription is filled and some pharmacies may offer cheaper prices.  The website www.goodrx.com contains coupons for medications through different pharmacies. The prices here do not account for what the cost may be with help from insurance (it may be cheaper with your insurance), but the website can give you the price if you did not use any insurance.  - You can print  the associated coupon and take it with your prescription to the pharmacy.  - You may also stop by our office during regular business hours and pick up a GoodRx coupon card.  - If you need your prescription sent electronically to a different pharmacy, notify our office through [facility] or by phone at [phone] option 4.     Si Usted Necesita Algo Despus de Su Visita  Tambin puede enviarnos un mensaje a travs de Clinical Cytogeneticist. Por lo general respondemos a los mensajes de MyChart en el transcurso de 1 a 2 das hbiles.  Para renovar recetas, por favor pida a su farmacia que se ponga en contacto con nuestra oficina. Randi lakes de fax es [place] [phone].  Si tiene un asunto urgente cuando la clnica est cerrada y que no puede esperar hasta el siguiente da hbil, puede llamar/localizar a su doctor(a) al nmero que aparece a continuacin.   Por favor, tenga en cuenta que aunque hacemos todo lo posible para estar disponibles para asuntos urgentes fuera del horario de [place], no estamos disponibles las 24 horas del da, los 7 [address]  [address] de la [place].   Si tiene un problema urgente y no puede comunicarse con nosotros, puede optar por buscar atencin mdica  en el consultorio de su doctor(a), en una clnica privada, en un centro de atencin urgente o en una sala de emergencias.  Si tiene engineer, drilling, por favor llame inmediatamente al 911 o vaya a la sala de emergencias.  Nmeros de bper  - Dr. Hester: [phone]  - Dra. Jackquline: [phone]  - Dr. Claudene: [phone]  - Dra. Kitts: [phone]  En caso de inclemencias del [place], por favor llame a nuestra lnea principal al [phone] para una actualizacin sobre el [place] de cualquier retraso o cierre.  Consejos para la medicacin en dermatologa: Por favor, guarde las cajas en las que vienen los medicamentos de uso tpico para ayudarle a seguir las instrucciones sobre dnde y cmo usarlos. Las farmacias  generalmente imprimen las instrucciones del medicamento slo en las cajas y no directamente en los tubos del [place].   Si su medicamento es muy caro, por favor, pngase en contacto con landry rieger llamando al [phone] y presione la opcin 4 o envenos un mensaje a travs de Clinical Cytogeneticist.   No podemos decirle cul ser su copago por los medicamentos por adelantado ya que esto es diferente dependiendo de la cobertura de su seguro. Sin embargo, es posible que podamos encontrar un medicamento sustituto a audiological scientist un formulario para que el seguro cubra el medicamento que se considera necesario.   Si se requiere una autorizacin previa para que su compaa de seguros cubra su medicamento, por favor permtanos de 1 a 2 das hbiles para completar este proceso.  Los precios de los medicamentos varan con frecuencia dependiendo del environmental consultant de dnde se surte la receta y alguna farmacias pueden ofrecer precios ms baratos.  El sitio web www.goodrx.com tiene cupones  para medicamentos de health and safety inspector. Los precios aqu no tienen en cuenta lo que podra costar con la ayuda del seguro (puede ser ms barato con su seguro), pero el sitio web puede darle el precio si no utiliz tourist information centre manager.  - Puede imprimir el cupn correspondiente y llevarlo con su receta a la farmacia.  - Tambin puede pasar por nuestra oficina durante el horario de atencin regular y education officer, museum una tarjeta de cupones de GoodRx.  - Si necesita que su receta se enve electrnicamente a una farmacia diferente, informe a nuestra oficina a travs de MyChart de [place] o por telfono llamando al [phone] y presione la opcin 4.

## 2024-05-22 ENCOUNTER — Ambulatory Visit: Admitting: Podiatry

## 2024-05-22 DIAGNOSIS — D2371 Other benign neoplasm of skin of right lower limb, including hip: Secondary | ICD-10-CM

## 2024-05-22 DIAGNOSIS — M79676 Pain in unspecified toe(s): Secondary | ICD-10-CM

## 2024-05-22 DIAGNOSIS — B351 Tinea unguium: Secondary | ICD-10-CM

## 2024-05-22 NOTE — Progress Notes (Signed)
 He presents today chief complaint of painful elongated toenails with benign skin lesions bilaterally.  Objective: Vital signs are stable alert oriented x 3.  There is no erythema edema cellulitis drainage or odor toenails are long thick yellow dystrophic onychomycotic benign thick skin bilaterally.  Assessment: Pain in limb secondary to onychomycosis benign skin lesions.  Plan: Debridement of benign skin lesions debridement of toenails 1 through 5 bilateral.  Follow-up with him in 3 months

## 2024-06-22 ENCOUNTER — Ambulatory Visit: Admitting: Dermatology

## 2024-08-23 ENCOUNTER — Ambulatory Visit: Admitting: Podiatry

## 2025-01-04 ENCOUNTER — Ambulatory Visit: Admitting: Dermatology
